# Patient Record
Sex: Female | Born: 1945
Health system: Southern US, Community
[De-identification: ages and names within clinical notes are randomized; demographics above are authoritative.]

## PROBLEM LIST (undated history)

## (undated) DIAGNOSIS — T7840XA Allergy, unspecified, initial encounter: Secondary | ICD-10-CM

## (undated) DIAGNOSIS — I471 Supraventricular tachycardia, unspecified: Secondary | ICD-10-CM

## (undated) DIAGNOSIS — E785 Hyperlipidemia, unspecified: Secondary | ICD-10-CM

## (undated) DIAGNOSIS — Z8619 Personal history of other infectious and parasitic diseases: Secondary | ICD-10-CM

## (undated) HISTORY — PX: OOPHORECTOMY: SHX86

## (undated) HISTORY — DX: Supraventricular tachycardia, unspecified: I47.10

## (undated) HISTORY — DX: Allergy, unspecified, initial encounter: T78.40XA

## (undated) HISTORY — DX: Hyperlipidemia, unspecified: E78.5

## (undated) HISTORY — DX: Personal history of other infectious and parasitic diseases: Z86.19

---

## 1978-06-12 HISTORY — PX: PELVIC LAPAROSCOPY: SHX162

## 1985-06-12 HISTORY — PX: ABDOMINAL HYSTERECTOMY: SHX81

## 1997-10-22 ENCOUNTER — Other Ambulatory Visit: Admission: RE | Admit: 1997-10-22 | Discharge: 1997-10-22 | Payer: Self-pay | Admitting: Obstetrics and Gynecology

## 1998-10-25 ENCOUNTER — Other Ambulatory Visit: Admission: RE | Admit: 1998-10-25 | Discharge: 1998-10-25 | Payer: Self-pay | Admitting: Obstetrics and Gynecology

## 1999-11-01 ENCOUNTER — Other Ambulatory Visit: Admission: RE | Admit: 1999-11-01 | Discharge: 1999-11-01 | Payer: Self-pay | Admitting: Obstetrics and Gynecology

## 2000-12-17 ENCOUNTER — Other Ambulatory Visit: Admission: RE | Admit: 2000-12-17 | Discharge: 2000-12-17 | Payer: Self-pay | Admitting: Obstetrics and Gynecology

## 2001-11-25 ENCOUNTER — Other Ambulatory Visit: Admission: RE | Admit: 2001-11-25 | Discharge: 2001-11-25 | Payer: Self-pay | Admitting: Obstetrics and Gynecology

## 2003-07-31 ENCOUNTER — Other Ambulatory Visit: Admission: RE | Admit: 2003-07-31 | Discharge: 2003-07-31 | Payer: Self-pay | Admitting: Obstetrics and Gynecology

## 2004-08-01 ENCOUNTER — Other Ambulatory Visit: Admission: RE | Admit: 2004-08-01 | Discharge: 2004-08-01 | Payer: Self-pay | Admitting: Obstetrics and Gynecology

## 2005-05-09 DIAGNOSIS — C4492 Squamous cell carcinoma of skin, unspecified: Secondary | ICD-10-CM

## 2005-05-09 HISTORY — DX: Squamous cell carcinoma of skin, unspecified: C44.92

## 2005-08-02 ENCOUNTER — Other Ambulatory Visit: Admission: RE | Admit: 2005-08-02 | Discharge: 2005-08-02 | Payer: Self-pay | Admitting: Obstetrics and Gynecology

## 2006-08-06 ENCOUNTER — Other Ambulatory Visit: Admission: RE | Admit: 2006-08-06 | Discharge: 2006-08-06 | Payer: Self-pay | Admitting: Obstetrics and Gynecology

## 2007-08-14 ENCOUNTER — Other Ambulatory Visit: Admission: RE | Admit: 2007-08-14 | Discharge: 2007-08-14 | Payer: Self-pay | Admitting: Obstetrics and Gynecology

## 2008-08-13 ENCOUNTER — Ambulatory Visit: Payer: Self-pay | Admitting: Obstetrics and Gynecology

## 2008-08-13 ENCOUNTER — Other Ambulatory Visit: Admission: RE | Admit: 2008-08-13 | Discharge: 2008-08-13 | Payer: Self-pay | Admitting: Obstetrics and Gynecology

## 2008-08-13 ENCOUNTER — Encounter: Payer: Self-pay | Admitting: Obstetrics and Gynecology

## 2009-08-16 ENCOUNTER — Other Ambulatory Visit: Admission: RE | Admit: 2009-08-16 | Discharge: 2009-08-16 | Payer: Self-pay | Admitting: Obstetrics and Gynecology

## 2009-08-16 ENCOUNTER — Ambulatory Visit: Payer: Self-pay | Admitting: Obstetrics and Gynecology

## 2009-10-20 ENCOUNTER — Ambulatory Visit: Payer: Self-pay | Admitting: Obstetrics and Gynecology

## 2010-08-29 ENCOUNTER — Encounter (INDEPENDENT_AMBULATORY_CARE_PROVIDER_SITE_OTHER): Payer: BC Managed Care – PPO | Admitting: Obstetrics and Gynecology

## 2010-08-29 ENCOUNTER — Other Ambulatory Visit: Payer: Self-pay | Admitting: Obstetrics and Gynecology

## 2010-08-29 ENCOUNTER — Other Ambulatory Visit (HOSPITAL_COMMUNITY)
Admission: RE | Admit: 2010-08-29 | Discharge: 2010-08-29 | Disposition: A | Discharge status: Home / Self Care | Payer: BC Managed Care – PPO | Source: Ambulatory Visit | Attending: Obstetrics and Gynecology | Admitting: Obstetrics and Gynecology

## 2010-08-29 DIAGNOSIS — Z01419 Encounter for gynecological examination (general) (routine) without abnormal findings: Secondary | ICD-10-CM

## 2010-08-29 DIAGNOSIS — R823 Hemoglobinuria: Secondary | ICD-10-CM

## 2010-08-29 DIAGNOSIS — Z124 Encounter for screening for malignant neoplasm of cervix: Secondary | ICD-10-CM | POA: Insufficient documentation

## 2011-02-23 LAB — HM COLONOSCOPY

## 2011-08-22 DIAGNOSIS — N809 Endometriosis, unspecified: Secondary | ICD-10-CM | POA: Insufficient documentation

## 2011-08-30 ENCOUNTER — Encounter: Payer: BC Managed Care – PPO | Admitting: Obstetrics and Gynecology

## 2011-08-31 ENCOUNTER — Encounter: Payer: Self-pay | Admitting: Obstetrics and Gynecology

## 2011-08-31 ENCOUNTER — Ambulatory Visit (INDEPENDENT_AMBULATORY_CARE_PROVIDER_SITE_OTHER): Payer: BC Managed Care – PPO | Admitting: Obstetrics and Gynecology

## 2011-08-31 VITALS — BP 110/62 | Ht 63.0 in | Wt 141.0 lb

## 2011-08-31 DIAGNOSIS — Z01419 Encounter for gynecological examination (general) (routine) without abnormal findings: Secondary | ICD-10-CM

## 2011-08-31 MED ORDER — ESTRADIOL ACETATE 0.1 MG/24HR VA RING: VAGINAL_RING | VAGINAL | Status: DC

## 2011-08-31 NOTE — Progress Notes (Signed)
Patient came to see me today for her annual GYN exam. She continues to do well in her Femring. She had her yearly mammogram today. She has had several normal bone densities. She does her lab through PCP. She is having no vaginal bleeding. She is having no pelvic pain.  HEENT: Within normal limits. Kennon Portela present Neck: No masses. Supraclavicular lymph nodes: Not enlarged. Breasts: Examined in both sitting and lying position. Symmetrical without skin changes or masses. Abdomen: Soft no masses guarding or rebound. No hernias. Pelvic: External within normal limits. BUS within normal limits. Vaginal examination shows good estrogen effect, no cystocele enterocele or rectocele. Cervix and uterus absent. Adnexa within normal limits. Rectovaginal confirmatory. Extremities within normal limits.  Assessment: Menopausal symptoms  Plan: Continue Femring 0.1 mg

## 2011-09-01 LAB — URINALYSIS W MICROSCOPIC + REFLEX CULTURE
Casts: NONE SEEN
Crystals: NONE SEEN
Leukocytes, UA: NEGATIVE
Nitrite: NEGATIVE
Specific Gravity, Urine: 1.016 (ref 1.005–1.030)
Squamous Epithelial / LPF: NONE SEEN
pH: 6.5 (ref 5.0–8.0)

## 2011-09-11 ENCOUNTER — Encounter: Payer: Self-pay | Admitting: Gynecology

## 2012-09-02 ENCOUNTER — Ambulatory Visit (INDEPENDENT_AMBULATORY_CARE_PROVIDER_SITE_OTHER): Payer: Medicare Other | Admitting: Gynecology

## 2012-09-02 ENCOUNTER — Encounter: Payer: Self-pay | Admitting: Gynecology

## 2012-09-02 VITALS — BP 120/78 | Ht 63.0 in | Wt 142.0 lb

## 2012-09-02 DIAGNOSIS — Z79890 Hormone replacement therapy (postmenopausal): Secondary | ICD-10-CM

## 2012-09-02 DIAGNOSIS — N952 Postmenopausal atrophic vaginitis: Secondary | ICD-10-CM

## 2012-09-02 MED ORDER — ESTRADIOL ACETATE 0.1 MG/24HR VA RING: VAGINAL_RING | VAGINAL | Status: DC

## 2012-09-02 NOTE — Progress Notes (Signed)
Katherine Blake 11/15/1945 [id number]        67 y.o.  [id number] for followup exam.  Former patient of Dr. Eda Paschal. Several issues noted below.  Past medical history,surgical history, medications, allergies, family history and social history were all reviewed and documented in the EPIC chart. ROS:  Was performed and pertinent positives and negatives are included in the history.  Exam: [name] assistant Filed Vitals:   09/02/12 1037  BP: 120/78  Height: 5\' 3"  (1.6 m)  Weight: 142 lb (64.411 kg)   General appearance  Normal Skin grossly normal Head/Neck normal with no cervical or supraclavicular adenopathy thyroid normal Lungs  clear Cardiac RR, without RMG Abdominal  soft, nontender, without masses, organomegaly or hernia Breasts  examined lying and sitting without masses, retractions, discharge or axillary adenopathy. Pelvic  Ext/BUS/vagina  normal with mild atrophic changes   Adnexa  Without masses or tenderness    Anus and perineum  normal   Rectovaginal  normal sphincter tone without palpated masses or tenderness.    Assessment/Plan:  67 y.o. [id number] female for followup exam.   1. HRT. Status post TAH/BSO for endometriosis. Patient is on Femring 0.1 mg for hot flashes/sweats doing well.  Does note some vaginal dryness with dyspareunia. Issues of HRT to include the [facility] study/transdermal/vaginal absorption benefits reviewed to include stroke heart attack DVT and breast cancer. Lowest dose for shortest period of time ACOG and NAMS statements reviewed.  Patient's comfortable using and wants to continue it I refilled her Femring. Options for vaginal support to include switching to a higher oral dose, adding vaginal support such as Estrace cream or Vagifem reviewed. Patient is not interested in just wants to monitor at present. 2. DEXA 10/2009 normal. Recommend repeating at the 5 year interval. Increase calcium vitamin D reviewed. 3. Mammography 08/2012. Continue with annual mammography. SBE  monthly reviewed. 4. Colonoscopy 2011. Repeat Advair recommended interval. 5. Pap smear 2012. No Pap smear done today. No history of significant abnormal Pap smears. He is status post hysterectomy for benign indications. Option to stop screening altogether or less frequent intervals reviewed. We'll readdress on an annual basis. 6. Health maintenance. No lab work done as it is all done through her primary physician's office who she actively sees. Followup one year, sooner as needed.    Dara Lords MD, 11:17 AM 09/02/2012

## 2012-09-02 NOTE — Patient Instructions (Signed)
Follow up in one year, sooner as needed. 

## 2013-09-04 ENCOUNTER — Encounter: Payer: Self-pay | Admitting: Gynecology

## 2013-09-04 ENCOUNTER — Telehealth: Payer: Self-pay | Admitting: *Deleted

## 2013-09-04 ENCOUNTER — Ambulatory Visit (INDEPENDENT_AMBULATORY_CARE_PROVIDER_SITE_OTHER): Payer: Medicare Other | Admitting: Gynecology

## 2013-09-04 VITALS — BP 116/74 | Ht 62.0 in | Wt 141.0 lb

## 2013-09-04 DIAGNOSIS — Z79890 Hormone replacement therapy (postmenopausal): Secondary | ICD-10-CM

## 2013-09-04 DIAGNOSIS — N952 Postmenopausal atrophic vaginitis: Secondary | ICD-10-CM

## 2013-09-04 MED ORDER — ESTRADIOL ACETATE 0.1 MG/24HR VA RING: VAGINAL_RING | VAGINAL | Status: DC

## 2013-09-04 NOTE — Progress Notes (Signed)
Katherine Blake October 09, 1945 [id number]        67 y.o.  [id number] for followup exam.  Several issues noted below.   Past medical history,surgical history, problem list, medications, allergies, family history and social history were all reviewed and documented in the EPIC chart.  ROS:  Performed and pertinent positives and negatives are included in the history, assessment and plan .  Exam: [name] assistant Filed Vitals:   09/04/13 0852  BP: 116/74  Height: 5\' 2"  (1.575 m)  Weight: 141 lb (63.957 kg)   General appearance  Normal Skin grossly normal Head/Neck normal with no cervical or supraclavicular adenopathy thyroid normal Lungs  clear Cardiac RR, without RMG Abdominal  soft, nontender, without masses, organomegaly or hernia Breasts  examined lying and sitting without masses, retractions, discharge or axillary adenopathy. Pelvic  Ext/BUS/vagina with generalized atrophic changes  Adnexa  Without masses or tenderness    Anus and perineum  Normal   Rectovaginal  Normal sphincter tone without palpated masses or tenderness.    Assessment/Plan:  68 y.o. [id number] female for followup exam.  1.  Postmenopausal/atrophic genital changes. Patient continues on Femring 0.1 mg doing well.  I again reviewed the whole issue of ERT with her to include the [facility] study with increased risk of stroke, heart attack, DVT and breast cancer. The ACOG and NAMS statements for lowest dose for the shortest period of time reviewed. Transvaginal versus oral first-pass effect benefit discussed.  Continuing ERT into the 60s discussed. At this point the patient wants to continue it I refilled her x1 year. She is going to decide whether she wants to try to stop or not. 2. Pap smear 2012. No Pap smear done today. Age 37 status post hysterectomy for benign indications. No history of abnormal Pap smears previously. Options to stop screaming altogether versus continued screening reviewed. Patient's comfortable with stop  screaming. 3. Mammography 08/2012. Patient due for mammogram now knows to schedule. Followup in one year, sooner as needed. 4. Colonoscopy 2012. Repeat at their recommended interval. 5. DEXA 2011 normal. Plan repeat DEXA next year at five-year interval. Increase calcium vitamin D reviewed. 6. Health maintenance. No routine blood work done. This is all done through her primary physician's office. Followup one year, sooner as needed.  Note: This document was prepared with digital dictation and possible smart phrase technology. Any transcriptional errors that result from this process are unintentional.   Katherine Auerbach MD, 9:20 AM 09/04/2013

## 2013-09-04 NOTE — Patient Instructions (Signed)
Followup in one year, sooner if any issues.  Estradiol vaginal ring (Femring) What is this medicine? ESTRADIOL (es tra DYE ole) vaginal ring is an insert that contains a female hormone. This medicine helps relieve symptoms of vaginal irritation and dryness that occurs in some women during menopause. This medicine can also help relieve hot flashes. This medicine may be used for other purposes; ask your health care provider or pharmacist if you have questions. COMMON BRAND NAME(S): Femring What should I tell my health care provider before I take this medicine? They need to know if you have any of these conditions: -abnormal vaginal bleeding -blood vessel disease or blood clots -breast, cervical, endometrial, ovarian, liver, or uterine cancer -dementia -diabetes -gallbladder disease -heart disease or recent heart attack -high blood pressure -high cholesterol -high level of calcium in the blood -hysterectomy -kidney disease -liver disease -migraine headaches -protein C deficiency -protein S deficiency -stroke -systemic lupus erythematosus (SLE) -tobacco smoker -an unusual or allergic reaction to estrogens, other hormones, medicines, foods, dyes, or preservatives -pregnant or trying to get pregnant -breast-feeding How should I use this medicine? This medicine may be inserted by you or your physician. Follow the directions that are included with your prescription. If you are unsure how to insert the ring, contact your doctor or health care professional. The vaginal ring should remain in place for 90 days. After 90 days you should replace your old ring and insert a new one. Do not stop using except on the advice of your doctor or health care professional. Contact your pediatrician regarding the use of this medicine in children. Special care may be needed. A patient package insert for the product will be given with each prescription and refill. Read this sheet carefully each time. The sheet  may change frequently. Overdosage: If you think you have taken too much of this medicine contact a poison control center or emergency room at once. NOTE: This medicine is only for you. Do not share this medicine with others. What if I miss a dose? If you miss a dose, use it as soon as you can. If it is almost time for your next dose, use only that dose. Do not use double or extra doses. What may interact with this medicine? Do not take this medicine with any of the following medications: -aromatase inhibitors like aminoglutethimide, anastrozole, exemestane, letrozole, testolactone, vorozole This medicine may also interact with the following medications: -carbamazepine -certain antibiotics used to treat infections -certain barbiturates used for inducing sleep or treating seizures -grapefruit juice -medicines for fungus infections like itraconazole and ketoconazole -raloxifene or tamoxifen -rifabutin, rifampin, or rifapentine -ritonavir -St. John's Wort This list may not describe all possible interactions. Give your health care provider a list of all the medicines, herbs, non-prescription drugs, or dietary supplements you use. Also tell them if you smoke, drink alcohol, or use illegal drugs. Some items may interact with your medicine. What should I watch for while using this medicine? Visit your doctor or health care professional for regular checks on your progress. You will need a regular breast and pelvic exam and Pap smear while on this medicine. You should also discuss the need for regular mammograms with your health care professional, and follow his or her guidelines for these tests. This medicine can make your body retain fluid, making your fingers, hands, or ankles swell. Your blood pressure can go up. Contact your doctor or health care professional if you feel you are retaining fluid. If you have any reason  to think you are pregnant, stop taking this medicine right away and contact your  doctor or health care professional. Smoking increases the risk of getting a blood clot or having a stroke while you are taking this medicine, especially if you are more than 66 years old. You are strongly advised not to smoke. If you wear contact lenses and notice visual changes, or if the lenses begin to feel uncomfortable, consult your eye doctor or health care professional. This medicine can increase the risk of developing a condition (endometrial hyperplasia) that may lead to cancer of the lining of the uterus. Taking progestins, another hormone drug, with this medicine lowers the risk of developing this condition. Therefore, if your uterus has not been removed (by a hysterectomy), your doctor may prescribe a progestin for you to take together with your estrogen. You should know, however, that taking estrogens with progestins may have additional health risks. You should discuss the use of estrogens and progestins with your health care professional to determine the benefits and risks for you. If you are going to have surgery, you may need to stop taking this medicine. Consult your health care professional for advice before you schedule the surgery. You may bathe or participate in other activities while using this medicine. You do not need to remove the vaginal ring during sexual or other activities unless you are more comfortable doing so. Within the 90-day dosage period, you may remove the vaginal ring, rinse it with clean lukewarm (not hot or boiling) water, and re-insert the ring as needed. What side effects may I notice from receiving this medicine? Side effects that you should report to your doctor or health care professional as soon as possible: -allergic reactions like skin rash, itching or hives, swelling of the face, lips, or tongue -breast tissue changes or discharge -changes in vision -chest pain -confusion, trouble speaking or understanding -dark urine -general ill feeling or flu-like  symptoms -light-colored stools -nausea, vomiting -pain, swelling, warmth in the leg -right upper belly pain -severe headaches -shortness of breath -sudden numbness or weakness of the face, arm or leg -trouble walking, dizziness, loss of balance or coordination -unusual vaginal bleeding -yellowing of the eyes or skin Side effects that usually do not require medical attention (report to your doctor or health care professional if they continue or are bothersome): -hair loss -increased hunger or thirst -increased urination -symptoms of vaginal infection like itching, irritation or unusual discharge -unusually weak or tired This list may not describe all possible side effects. Call your doctor for medical advice about side effects. You may report side effects to FDA at 1-800-FDA-1088. Where should I keep my medicine? Keep out of the reach of children. Store at room temperature between 15 and 30 degrees C (59 and 86 degrees F). Throw away any unused medicine after the expiration date. NOTE: This sheet is a summary. It may not cover all possible information. If you have questions about this medicine, talk to your doctor, pharmacist, or health care provider.  2014, [employer] Standard. (2010-08-31 09:10:06)

## 2013-09-04 NOTE — Telephone Encounter (Signed)
Prior authorization for femring sent to [facility], will wait for response.

## 2013-09-05 LAB — URINALYSIS W MICROSCOPIC + REFLEX CULTURE
BILIRUBIN URINE: NEGATIVE
Bacteria, UA: NONE SEEN
Casts: NONE SEEN
Crystals: NONE SEEN
Glucose, UA: NEGATIVE mg/dL
Hgb urine dipstick: NEGATIVE
Ketones, ur: NEGATIVE mg/dL
LEUKOCYTES UA: NEGATIVE
Nitrite: NEGATIVE
PROTEIN: NEGATIVE mg/dL
SQUAMOUS EPITHELIAL / LPF: NONE SEEN
Specific Gravity, Urine: 1.015 (ref 1.005–1.030)
UROBILINOGEN UA: 0.2 mg/dL (ref 0.0–1.0)
pH: 5.5 (ref 5.0–8.0)

## 2013-09-17 ENCOUNTER — Other Ambulatory Visit: Payer: Self-pay | Admitting: Dermatology

## 2013-12-11 LAB — CBC AND DIFFERENTIAL
HEMATOCRIT: 37 (ref 36–46)
HEMOGLOBIN: 12.5 (ref 12.0–16.0)
NEUTROS ABS: 4
PLATELETS: 308 (ref 150–399)
WBC: 6.1

## 2014-04-13 ENCOUNTER — Encounter: Payer: Self-pay | Admitting: Gynecology

## 2014-05-06 LAB — FECAL OCCULT BLOOD, GUAIAC: Fecal Occult Blood: NEGATIVE

## 2014-05-19 ENCOUNTER — Other Ambulatory Visit: Payer: Self-pay | Admitting: Dermatology

## 2014-09-10 ENCOUNTER — Encounter: Payer: Self-pay | Admitting: Gynecology

## 2014-09-10 ENCOUNTER — Ambulatory Visit (INDEPENDENT_AMBULATORY_CARE_PROVIDER_SITE_OTHER): Payer: Medicare Other | Admitting: Gynecology

## 2014-09-10 VITALS — BP 120/72 | Ht 62.0 in | Wt 135.0 lb

## 2014-09-10 DIAGNOSIS — N952 Postmenopausal atrophic vaginitis: Secondary | ICD-10-CM | POA: Diagnosis not present

## 2014-09-10 DIAGNOSIS — Z79890 Hormone replacement therapy (postmenopausal): Secondary | ICD-10-CM | POA: Diagnosis not present

## 2014-09-10 DIAGNOSIS — Z01419 Encounter for gynecological examination (general) (routine) without abnormal findings: Secondary | ICD-10-CM | POA: Diagnosis not present

## 2014-09-10 MED ORDER — ESTRADIOL ACETATE 0.1 MG/24HR VA RING: VAGINAL_RING | VAGINAL | Status: DC

## 2014-09-10 NOTE — Progress Notes (Signed)
Katherine Blake 13-Feb-1946 [id number]        69 y.o.  G2P2002 for breast and pelvic exam. Several issues noted below  Past medical history,surgical history, problem list, medications, allergies, family history and social history were all reviewed and documented as reviewed in the EPIC chart.  ROS:  Performed with pertinent positives and negatives included in the history, assessment and plan.   Additional significant findings :  none   Exam: [name] Counsellor Vitals:   09/10/14 0902  BP: 120/72  Height: 5\' 2"  (1.575 m)  Weight: 135 lb (61.236 kg)   General appearance:  Normal affect, orientation and appearance. Skin: Grossly normal HEENT: Without gross lesions.  No cervical or supraclavicular adenopathy. Thyroid normal.  Lungs:  Clear without wheezing, rales or rhonchi Cardiac: RR, without RMG Abdominal:  Soft, nontender, without masses, guarding, rebound, organomegaly or hernia Breasts:  Examined lying and sitting without masses, retractions, discharge or axillary adenopathy. Pelvic:  Ext/BUS/vagina with generalized atrophic changes  Adnexa  Without masses or tenderness    Anus and perineum  Normal   Rectovaginal  Normal sphincter tone without palpated masses or tenderness.    Assessment/Plan:  69 y.o. [id number] female for breast and pelvic exam.   1. Postmenopausal/HRT. Status post TAH/BSO 1987 for endometriosis using Femring 0.1 doing well. Had started with 0.05 but increased to 0.1 due to insufficient relief of her symptoms originally. I again reviewed the whole issue of HRT with advancing age and increased risk of stroke heart attack DVT and possible breast cancer. Options to wean versus trying a lower dose Femring discussed. At this point patient does not want to change, understands the risks and accepts them. I refilled her Femring 1 year. 2. Pap smear 2012. No Pap smear done today. No history of abnormal Pap smears previously. Status post hysterectomy for benign indications.  We both agreed to stop screening per current screening guidelines issues over the age of 67 and status post hysterectomy. 3. Mammography today. Continue with annual mammography. SBE monthly reviewed. 4. DEXA 2011 normal. Repeat DEXA now at 5 year interval. Increased calcium vitamin D reviewed. 5. Colonoscopy 2012. Repeat at their recommended interval. 6. Health maintenance. No routine blood work done as this is done at her primary physician's office. Follow up 1 year, sooner as needed.     Anastasio Auerbach MD, 9:23 AM 09/10/2014

## 2014-09-10 NOTE — Patient Instructions (Signed)
You may obtain a copy of any labs that were done today by logging onto MyChart as outlined in the instructions provided with your AVS (after visit summary). The office will not call with normal lab results but certainly if there are any significant abnormalities then we will contact you.   Health Maintenance, Female A healthy lifestyle and preventative care can promote health and wellness.  Maintain regular health, dental, and eye exams.  Eat a healthy diet. Foods like vegetables, fruits, whole grains, low-fat dairy products, and lean protein foods contain the nutrients you need without too many calories. Decrease your intake of foods high in solid fats, added sugars, and salt. Get information about a proper diet from your caregiver, if necessary.  Regular physical exercise is one of the most important things you can do for your health. Most adults should get at least 150 minutes of moderate-intensity exercise (any activity that increases your heart rate and causes you to sweat) each week. In addition, most adults need muscle-strengthening exercises on 2 or more days a week.   Maintain a healthy weight. The body mass index (BMI) is a screening tool to identify possible weight problems. It provides an estimate of body fat based on height and weight. Your caregiver can help determine your BMI, and can help you achieve or maintain a healthy weight. For adults 20 years and older:  A BMI below 18.5 is considered underweight.  A BMI of 18.5 to 24.9 is normal.  A BMI of 25 to 29.9 is considered overweight.  A BMI of 30 and above is considered obese.  Maintain normal blood lipids and cholesterol by exercising and minimizing your intake of saturated fat. Eat a balanced diet with plenty of fruits and vegetables. Blood tests for lipids and cholesterol should begin at age 61 and be repeated every 5 years. If your lipid or cholesterol levels are high, you are over 50, or you are a high risk for heart  disease, you may need your cholesterol levels checked more frequently.Ongoing high lipid and cholesterol levels should be treated with medicines if diet and exercise are not effective.  If you smoke, find out from your caregiver how to quit. If you do not use tobacco, do not start.  Lung cancer screening is recommended for adults aged 33 80 years who are at high risk for developing lung cancer because of a history of smoking. Yearly low-dose computed tomography (CT) is recommended for people who have at least a 30-pack-year history of smoking and are a current smoker or have quit within the past 15 years. A pack year of smoking is smoking an average of 1 pack of cigarettes a day for 1 year (for example: 1 pack a day for 30 years or 2 packs a day for 15 years). Yearly screening should continue until the smoker has stopped smoking for at least 15 years. Yearly screening should also be stopped for people who develop a health problem that would prevent them from having lung cancer treatment.  If you are pregnant, do not drink alcohol. If you are breastfeeding, be very cautious about drinking alcohol. If you are not pregnant and choose to drink alcohol, do not exceed 1 drink per day. One drink is considered to be 12 ounces (355 mL) of beer, 5 ounces (148 mL) of wine, or 1.5 ounces (44 mL) of liquor.  Avoid use of street drugs. Do not share needles with anyone. Ask for help if you need support or instructions about stopping  the use of drugs.  High blood pressure causes heart disease and increases the risk of stroke. Blood pressure should be checked at least every 1 to 2 years. Ongoing high blood pressure should be treated with medicines, if weight loss and exercise are not effective.  If you are 96 to 67 years old, ask your caregiver if you should take aspirin to prevent strokes.  Diabetes screening involves taking a blood sample to check your fasting blood sugar level. This should be done once every 3  years, after age 10, if you are within normal weight and without risk factors for diabetes. Testing should be considered at a younger age or be carried out more frequently if you are overweight and have at least 1 risk factor for diabetes.  Breast cancer screening is essential preventative care for women. You should practice "breast self-awareness." This means understanding the normal appearance and feel of your breasts and may include breast self-examination. Any changes detected, no matter how small, should be reported to a caregiver. Women in their 70s and 30s should have a clinical breast exam (CBE) by a caregiver as part of a regular health exam every 1 to 3 years. After age 31, women should have a CBE every year. Starting at age 44, women should consider having a mammogram (breast X-ray) every year. Women who have a family history of breast cancer should talk to their caregiver about genetic screening. Women at a high risk of breast cancer should talk to their caregiver about having an MRI and a mammogram every year.  Breast cancer gene (BRCA)-related cancer risk assessment is recommended for women who have family members with BRCA-related cancers. BRCA-related cancers include breast, ovarian, tubal, and peritoneal cancers. Having family members with these cancers may be associated with an increased risk for harmful changes (mutations) in the breast cancer genes BRCA1 and BRCA2. Results of the assessment will determine the need for genetic counseling and BRCA1 and BRCA2 testing.  The Pap test is a screening test for cervical cancer. Women should have a Pap test starting at age 70. Between ages 21 and 44, Pap tests should be repeated every 2 years. Beginning at age 80, you should have a Pap test every 3 years as long as the past 3 Pap tests have been normal. If you had a hysterectomy for a problem that was not cancer or a condition that could lead to cancer, then you no longer need Pap tests. If you are  between ages 59 and 50, and you have had normal Pap tests going back 10 years, you no longer need Pap tests. If you have had past treatment for cervical cancer or a condition that could lead to cancer, you need Pap tests and screening for cancer for at least 20 years after your treatment. If Pap tests have been discontinued, risk factors (such as a new sexual partner) need to be reassessed to determine if screening should be resumed. Some women have medical problems that increase the chance of getting cervical cancer. In these cases, your caregiver may recommend more frequent screening and Pap tests.  The human papillomavirus (HPV) test is an additional test that may be used for cervical cancer screening. The HPV test looks for the virus that can cause the cell changes on the cervix. The cells collected during the Pap test can be tested for HPV. The HPV test could be used to screen women aged 56 years and older, and should be used in women of any age  who have unclear Pap test results. After the age of 55, women should have HPV testing at the same frequency as a Pap test.  Colorectal cancer can be detected and often prevented. Most routine colorectal cancer screening begins at the age of 44 and continues through age 20. However, your caregiver may recommend screening at an earlier age if you have risk factors for colon cancer. On a yearly basis, your caregiver may provide home test kits to check for hidden blood in the stool. Use of a small camera at the end of a tube, to directly examine the colon (sigmoidoscopy or colonoscopy), can detect the earliest forms of colorectal cancer. Talk to your caregiver about this at age 86, when routine screening begins. Direct examination of the colon should be repeated every 5 to 10 years through age 13, unless early forms of pre-cancerous polyps or small growths are found.  Hepatitis C blood testing is recommended for all people born from [birth year] through 1963 and any  individual with known risks for hepatitis C.  Practice safe sex. Use condoms and avoid high-risk sexual practices to reduce the spread of sexually transmitted infections (STIs). Sexually active women aged 36 and younger should be checked for Chlamydia, which is a common sexually transmitted infection. Older women with new or multiple partners should also be tested for Chlamydia. Testing for other STIs is recommended if you are sexually active and at increased risk.  Osteoporosis is a disease in which the bones lose minerals and strength with aging. This can result in serious bone fractures. The risk of osteoporosis can be identified using a bone density scan. Women ages 20 and over and women at risk for fractures or osteoporosis should discuss screening with their caregivers. Ask your caregiver whether you should be taking a calcium supplement or vitamin D to reduce the rate of osteoporosis.  Menopause can be associated with physical symptoms and risks. Hormone replacement therapy is available to decrease symptoms and risks. You should talk to your caregiver about whether hormone replacement therapy is right for you.  Use sunscreen. Apply sunscreen liberally and repeatedly throughout the day. You should seek shade when your shadow is shorter than you. Protect yourself by wearing long sleeves, pants, a wide-brimmed hat, and sunglasses year round, whenever you are outdoors.  Notify your caregiver of new moles or changes in moles, especially if there is a change in shape or color. Also notify your caregiver if a mole is larger than the size of a pencil eraser.  Stay current with your immunizations. Document Released: 12/12/2010 Document Revised: 09/23/2012 Document Reviewed: 12/12/2010 [facility] Patient Information 2014 [employer].

## 2014-09-11 ENCOUNTER — Encounter: Payer: Self-pay | Admitting: Gynecology

## 2014-09-11 LAB — URINALYSIS W MICROSCOPIC + REFLEX CULTURE
Bilirubin Urine: NEGATIVE
Casts: NONE SEEN
Crystals: NONE SEEN
GLUCOSE, UA: NEGATIVE mg/dL
Hgb urine dipstick: NEGATIVE
Ketones, ur: NEGATIVE mg/dL
Leukocytes, UA: NEGATIVE
Nitrite: NEGATIVE
Protein, ur: NEGATIVE mg/dL
Specific Gravity, Urine: 1.021 (ref 1.005–1.030)
UROBILINOGEN UA: 0.2 mg/dL (ref 0.0–1.0)
pH: 5.5 (ref 5.0–8.0)

## 2014-09-12 LAB — URINE CULTURE: Colony Count: 3000

## 2014-10-01 ENCOUNTER — Ambulatory Visit (INDEPENDENT_AMBULATORY_CARE_PROVIDER_SITE_OTHER): Payer: Medicare Other

## 2014-10-01 ENCOUNTER — Other Ambulatory Visit: Payer: Self-pay | Admitting: Gynecology

## 2014-10-01 DIAGNOSIS — Z780 Asymptomatic menopausal state: Secondary | ICD-10-CM | POA: Diagnosis not present

## 2014-10-01 DIAGNOSIS — Z79890 Hormone replacement therapy (postmenopausal): Secondary | ICD-10-CM

## 2014-12-06 ENCOUNTER — Other Ambulatory Visit: Payer: Self-pay | Admitting: Gynecology

## 2014-12-07 ENCOUNTER — Telehealth: Payer: Self-pay | Admitting: *Deleted

## 2014-12-07 MED ORDER — ESTRADIOL 0.1 MG/24HR TD PTTW: 1.0000 | MEDICATED_PATCH | TRANSDERMAL | Status: DC

## 2014-12-07 NOTE — Telephone Encounter (Signed)
Pt called stating her femring has increased to $460 per month, she asked if you could give her some options of other medications? Please advise

## 2014-12-07 NOTE — Telephone Encounter (Signed)
Pt would like to try to vivelle patch twice weekly, Rx will be sent.

## 2014-12-07 NOTE — Telephone Encounter (Signed)
There are several options that range from pills to patches to lotions. I would recommend a trial of the 0.1 mg Vivelle patch twice weekly. If she wants to talk about it in more detailed then I recommend office visit.

## 2014-12-22 ENCOUNTER — Telehealth: Payer: Self-pay | Admitting: *Deleted

## 2014-12-22 NOTE — Telephone Encounter (Signed)
Pt was prescribed estradiol patch 0.1 mg on 12/07/14 states patches were not working, decided to switch back to femring and pay the price.

## 2015-01-06 ENCOUNTER — Other Ambulatory Visit: Payer: Self-pay | Admitting: Dermatology

## 2015-01-25 ENCOUNTER — Ambulatory Visit (INDEPENDENT_AMBULATORY_CARE_PROVIDER_SITE_OTHER): Payer: Medicare Other | Admitting: Gynecology

## 2015-01-25 ENCOUNTER — Encounter: Payer: Self-pay | Admitting: Gynecology

## 2015-01-25 VITALS — BP 120/76

## 2015-01-25 DIAGNOSIS — Z79890 Hormone replacement therapy (postmenopausal): Secondary | ICD-10-CM | POA: Diagnosis not present

## 2015-01-25 NOTE — Progress Notes (Signed)
BARBARA AHART 1945-12-26 [id number]        69 y.o.  [id number] Presents to discuss HRT. She was on Femring which cost $400. Was switched to Vivelle 0.1 milligram patch. Doing well with this.  Has done reading about HRT. She was worried about increased risk of dementia as her mother was just placed into a nursing facility because of this.  Past medical history,surgical history, problem list, medications, allergies, family history and social history were all reviewed and documented in the EPIC chart.  Directed ROS with pertinent positives and negatives documented in the history of present illness/assessment and plan.  Exam: Filed Vitals:   01/25/15 0806  BP: 120/76   General appearance:  Normal  Assessment/Plan:  69 y.o. [id number] on HRT with questions. I reviewed the whole issue of HRT to include the WHI study, ACOG and NAMS recommendations. I discussed the timing hypothesis as far as starting earlier versus starting later and the most recent [place] study showing an increased risk of stroke and heart attack within the first year after discontinuation of HRT and patients in their 56s. The risks of stroke heart attack DVT, breast cancer, dementia versus benefits to include possible cardiovascular started early colon cancer bone health symptom relief reviewed. The issues of estrogen alone versus estrogen and progesterone with a uterus also discussed.  I did discuss there are studies that did show a higher risk of dementia in estrogen users and again the question as to whether starting early has an effect on this versus starting later. After lengthy discussion at this point the patient's comfortable continuing on HRT. She is going to continue on the patch given the financial issues with the ring at $400 a ring and she will follow up when she is due for her exam.    Anastasio Auerbach MD, 9:13 AM 01/25/2015

## 2015-01-25 NOTE — Patient Instructions (Signed)
Follow up when you're due for your annual exam.

## 2015-02-01 ENCOUNTER — Telehealth: Payer: Self-pay | Admitting: *Deleted

## 2015-02-01 NOTE — Telephone Encounter (Signed)
Prior authorization for estradiol 0.1 mg patch done on cover [email or web address] will wait for response.

## 2015-02-05 NOTE — Telephone Encounter (Signed)
Rx was denied by [facility] states has not met non-formulary exception criteria requirements. I called pt and spoke with her about this and she states she is paying out of pocket for patches. No appeal will be filed.

## 2015-04-29 LAB — LIPID PANEL
Cholesterol: 239 — AB (ref 0–200)
HDL: 85 — AB (ref 35–70)
LDL Cholesterol: 139
LDL/HDL RATIO: 3
Triglycerides: 73 (ref 40–160)

## 2015-04-29 LAB — BASIC METABOLIC PANEL
BUN: 17 (ref 4–21)
CREATININE: 0.5 (ref 0.5–1.1)
GLUCOSE: 92
POTASSIUM: 4.3 (ref 3.4–5.3)
SODIUM: 139 (ref 137–147)

## 2015-04-29 LAB — HEPATIC FUNCTION PANEL
ALT: 14 (ref 7–35)
AST: 19 (ref 13–35)
Alkaline Phosphatase: 52 (ref 25–125)
BILIRUBIN, TOTAL: 0.4

## 2015-05-10 ENCOUNTER — Encounter: Payer: Self-pay | Admitting: Gynecology

## 2015-05-10 ENCOUNTER — Encounter: Payer: Self-pay | Admitting: *Deleted

## 2015-07-06 ENCOUNTER — Telehealth: Payer: Self-pay | Admitting: *Deleted

## 2015-07-06 MED ORDER — ESTRADIOL ACETATE 0.1 MG/24HR VA RING: VAGINAL_RING | VAGINAL | Status: DC

## 2015-07-06 NOTE — Telephone Encounter (Signed)
Okay to switch back to Femring 0.1 mg through her next annual exam

## 2015-07-06 NOTE — Telephone Encounter (Signed)
Pt informed, Rx sent, annual scheduled in April

## 2015-07-06 NOTE — Telephone Encounter (Signed)
Pt currently takes estradiol 01 mg patches c/o increase night sweats, and tender breast, having trouble remembering to change patch. Pt would much rather switch back to femring, she spoke with her insurance company and the price had decreased some. Pt said she felt great with femring with no symptoms. Please advise

## 2015-07-10 DIAGNOSIS — B9789 Other viral agents as the cause of diseases classified elsewhere: Secondary | ICD-10-CM | POA: Diagnosis not present

## 2015-07-10 DIAGNOSIS — J988 Other specified respiratory disorders: Secondary | ICD-10-CM | POA: Diagnosis not present

## 2015-07-21 ENCOUNTER — Other Ambulatory Visit: Payer: Self-pay | Admitting: Dermatology

## 2015-07-21 DIAGNOSIS — L821 Other seborrheic keratosis: Secondary | ICD-10-CM | POA: Diagnosis not present

## 2015-07-21 DIAGNOSIS — D485 Neoplasm of uncertain behavior of skin: Secondary | ICD-10-CM | POA: Diagnosis not present

## 2015-09-13 ENCOUNTER — Encounter: Payer: Self-pay | Admitting: Gynecology

## 2015-09-13 ENCOUNTER — Ambulatory Visit (INDEPENDENT_AMBULATORY_CARE_PROVIDER_SITE_OTHER): Payer: Medicare Other | Admitting: Gynecology

## 2015-09-13 VITALS — BP 118/74 | Ht 63.0 in | Wt 141.0 lb

## 2015-09-13 DIAGNOSIS — N952 Postmenopausal atrophic vaginitis: Secondary | ICD-10-CM | POA: Diagnosis not present

## 2015-09-13 DIAGNOSIS — Z79890 Hormone replacement therapy (postmenopausal): Secondary | ICD-10-CM

## 2015-09-13 DIAGNOSIS — Z01419 Encounter for gynecological examination (general) (routine) without abnormal findings: Secondary | ICD-10-CM | POA: Diagnosis not present

## 2015-09-13 DIAGNOSIS — Z1272 Encounter for screening for malignant neoplasm of vagina: Secondary | ICD-10-CM | POA: Diagnosis not present

## 2015-09-13 MED ORDER — ESTRADIOL ACETATE 0.1 MG/24HR VA RING
VAGINAL_RING | VAGINAL | Status: DC
Start: 2015-09-13 — End: 2016-09-15

## 2015-09-13 NOTE — Patient Instructions (Signed)

## 2015-09-13 NOTE — Progress Notes (Signed)
    Katherine Blake 1947/09/68 [id number]        70 y.o.  [id number]  for breast and pelvic exam.  Past medical history,surgical history, problem list, medications, allergies, family history and social history were all reviewed and documented as reviewed in the EPIC chart.  ROS:  Performed with pertinent positives and negatives included in the history, assessment and plan.   Additional significant findings :  none   Exam: Caryn Bee assistant Filed Vitals:   09/13/15 0857  BP: 118/74  Height: 5\' 3"  (1.6 m)  Weight: 141 lb (63.957 kg)   General appearance:  Normal affect, orientation and appearance. Skin: Grossly normal HEENT: Without gross lesions.  No cervical or supraclavicular adenopathy. Thyroid normal.  Lungs:  Clear without wheezing, rales or rhonchi Cardiac: RR, without RMG Abdominal:  Soft, nontender, without masses, guarding, rebound, organomegaly or hernia Breasts:  Examined lying and sitting without masses, retractions, discharge or axillary adenopathy. Pelvic:  Ext/BUS/vagina with atrophic changes Femring in place. Pap smear of cuff done  Adnexa without masses or tenderness    Anus and perineum normal   Rectovaginal normal sphincter tone without palpated masses or tenderness.    Assessment/Plan:  70 y.o. [id number] female for breast and pelvic exam.   1. Postmenopausal/atrophic genital changes.  Currently on Femring 0.1 mg. Had transiently tried patches due to cost but her Femring's cost is cheaper and she returned to this.  Status post TAH/BSO for endometriosis 1987. Again reviewed the issues and risks of ERT to include increased risk of stroke heart attack DVT and possible breast cancer. Options for weaning versus continuing discussed. At this point patient wants to continue understanding and accepting the risks and I refilled 1 year. 2. Pap smear 2012. Pap smear of vaginal cuff done today. Reviewed current screening guidelines. Options to stop screening altogether based  on age and hysterectomy history discussed. Will readdress on an annual basis. 3. Mammography scheduled and she'll follow up for this. SBE monthly reviewed. 4. DEXA 04/2015 normal. Plan repeat at 5 year interval. Increase calcium vitamin D reviewed. 5. Colonoscopy 2012. She is unclear when she is due to repeat this. She is going to call the facility where she had it done check with them and follow their recommendations. 6. Health maintenance. No routine lab work done as the patient does this at her primary physician's office. Follow up 1 year, sooner as needed.   Anastasio Auerbach MD, 10:02 AM 09/13/2015

## 2015-09-14 ENCOUNTER — Telehealth: Payer: Self-pay | Admitting: *Deleted

## 2015-09-14 LAB — PAP IG W/ RFLX HPV ASCU

## 2015-09-14 NOTE — Telephone Encounter (Signed)
PA approved effective 06/15/15-09/12/16 for femring 0.1 mg, pharmacy informed as well.

## 2015-09-29 DIAGNOSIS — Z1231 Encounter for screening mammogram for malignant neoplasm of breast: Secondary | ICD-10-CM | POA: Diagnosis not present

## 2015-12-30 DIAGNOSIS — H52203 Unspecified astigmatism, bilateral: Secondary | ICD-10-CM | POA: Diagnosis not present

## 2015-12-30 DIAGNOSIS — H5213 Myopia, bilateral: Secondary | ICD-10-CM | POA: Diagnosis not present

## 2016-03-17 DIAGNOSIS — Z23 Encounter for immunization: Secondary | ICD-10-CM | POA: Diagnosis not present

## 2016-04-10 ENCOUNTER — Other Ambulatory Visit: Payer: Self-pay | Admitting: Dermatology

## 2016-04-10 DIAGNOSIS — D044 Carcinoma in situ of skin of scalp and neck: Secondary | ICD-10-CM | POA: Diagnosis not present

## 2016-04-10 DIAGNOSIS — L570 Actinic keratosis: Secondary | ICD-10-CM | POA: Diagnosis not present

## 2016-04-10 DIAGNOSIS — D0439 Carcinoma in situ of skin of other parts of face: Secondary | ICD-10-CM | POA: Diagnosis not present

## 2016-04-10 DIAGNOSIS — C4492 Squamous cell carcinoma of skin, unspecified: Secondary | ICD-10-CM

## 2016-04-10 HISTORY — DX: Squamous cell carcinoma of skin, unspecified: C44.92

## 2016-04-26 DIAGNOSIS — D044 Carcinoma in situ of skin of scalp and neck: Secondary | ICD-10-CM | POA: Diagnosis not present

## 2016-04-26 DIAGNOSIS — D0439 Carcinoma in situ of skin of other parts of face: Secondary | ICD-10-CM | POA: Diagnosis not present

## 2016-05-13 DIAGNOSIS — S161XXA Strain of muscle, fascia and tendon at neck level, initial encounter: Secondary | ICD-10-CM | POA: Diagnosis not present

## 2016-05-29 DIAGNOSIS — J029 Acute pharyngitis, unspecified: Secondary | ICD-10-CM | POA: Diagnosis not present

## 2016-06-21 DIAGNOSIS — J0100 Acute maxillary sinusitis, unspecified: Secondary | ICD-10-CM | POA: Diagnosis not present

## 2016-06-27 DIAGNOSIS — Z85828 Personal history of other malignant neoplasm of skin: Secondary | ICD-10-CM | POA: Diagnosis not present

## 2016-06-27 DIAGNOSIS — L821 Other seborrheic keratosis: Secondary | ICD-10-CM | POA: Diagnosis not present

## 2016-06-27 DIAGNOSIS — D229 Melanocytic nevi, unspecified: Secondary | ICD-10-CM | POA: Diagnosis not present

## 2016-08-17 DIAGNOSIS — J069 Acute upper respiratory infection, unspecified: Secondary | ICD-10-CM | POA: Diagnosis not present

## 2016-09-15 ENCOUNTER — Ambulatory Visit (INDEPENDENT_AMBULATORY_CARE_PROVIDER_SITE_OTHER): Payer: Medicare Other | Admitting: Gynecology

## 2016-09-15 ENCOUNTER — Encounter: Payer: Self-pay | Admitting: Gynecology

## 2016-09-15 VITALS — BP 120/76 | Ht 62.0 in | Wt 146.0 lb

## 2016-09-15 DIAGNOSIS — N952 Postmenopausal atrophic vaginitis: Secondary | ICD-10-CM

## 2016-09-15 DIAGNOSIS — Z01411 Encounter for gynecological examination (general) (routine) with abnormal findings: Secondary | ICD-10-CM | POA: Diagnosis not present

## 2016-09-15 MED ORDER — ESTRADIOL ACETATE 0.1 MG/24HR VA RING: VAGINAL_RING | VAGINAL | 4 refills | Status: DC

## 2016-09-15 NOTE — Patient Instructions (Signed)

## 2016-09-15 NOTE — Progress Notes (Signed)
    Katherine Blake 03-06-1946 [id number]        71 y.o.  [id number] for breast and pelvic exam.  Past medical history,surgical history, problem list, medications, allergies, family history and social history were all reviewed and documented as reviewed in the EPIC chart.  ROS:  Performed with pertinent positives and negatives included in the history, assessment and plan.   Additional significant findings :   None   Exam: Caryn Bee assistant Vitals:   09/15/16 0913  BP: 120/76  Weight: 146 lb (66.2 kg)  Height: 5\' 2"  (1.575 m)   Body mass index is 26.7 kg/m.  General appearance:  Normal affect, orientation and appearance. Skin: Grossly normal HEENT: Without gross lesions.  No cervical or supraclavicular adenopathy. Thyroid normal.  Lungs:  Clear without wheezing, rales or rhonchi Cardiac: RR, without RMG Abdominal:  Soft, nontender, without masses, guarding, rebound, organomegaly or hernia Breasts:  Examined lying and sitting without masses, retractions, discharge or axillary adenopathy. Pelvic:  Ext, BUS, Vagina:  with atrophic changes  Adnexa: Without masses or tenderness    Anus and perineum: Normal   Rectovaginal: Normal sphincter tone without palpated masses or tenderness.    Assessment/Plan:  71 y.o. [id number] female breasts and pelvic exam.   1. Postmenopausal/atrophic genital changes. Status post TAH/BSO for endometriosis 1987. Using Femring 0.1 mg. Doing well with this and wants to continue. I again reviewed the risks to include increased risk of thrombosis such as stroke heart attack DVT and the breast cancer issue. Patient's comfortable continuing and I refilled her 1 year. 2. Pap smear 2017. No Pap smear done today. Options to stop screening per current screening guidelines based on age and hysterectomy history reviewed. Will readdress on annual basis. 3. Mammography 09/2015. Follow up for mammogram now as she is due and she will arrange this. SBE monthly  reviewed. 4. DEXA 2016 normal. Plan repeat DEXA at 5 year interval. 5. Colonoscopy 2012. Repeat at their recommended interval. 6. Health maintenance. No routine lab work done as patient does this elsewhere. Follow up in one year, sooner as needed.   Anastasio Auerbach MD, 9:42 AM 09/15/2016

## 2016-10-02 ENCOUNTER — Encounter: Payer: Self-pay | Admitting: Gynecology

## 2016-10-02 DIAGNOSIS — Z1231 Encounter for screening mammogram for malignant neoplasm of breast: Secondary | ICD-10-CM | POA: Diagnosis not present

## 2016-10-20 ENCOUNTER — Other Ambulatory Visit: Payer: Self-pay | Admitting: Allergy and Immunology

## 2016-10-20 ENCOUNTER — Ambulatory Visit
Admission: RE | Admit: 2016-10-20 | Discharge: 2016-10-20 | Disposition: A | Discharge status: Home / Self Care | Payer: Medicare Other | Source: Ambulatory Visit | Attending: Allergy and Immunology | Admitting: Allergy and Immunology

## 2016-10-20 DIAGNOSIS — J3089 Other allergic rhinitis: Secondary | ICD-10-CM | POA: Diagnosis not present

## 2016-10-20 DIAGNOSIS — R05 Cough: Secondary | ICD-10-CM | POA: Diagnosis not present

## 2016-10-20 DIAGNOSIS — R059 Cough, unspecified: Secondary | ICD-10-CM

## 2016-11-08 ENCOUNTER — Telehealth: Payer: Self-pay | Admitting: *Deleted

## 2016-11-08 NOTE — Telephone Encounter (Signed)
PA done via [facility] for femring 0.1, faxed to [facility] will wait for response.

## 2016-11-09 NOTE — Telephone Encounter (Signed)
Medicare denied coverage for Femring must try and fail a formulary medication such as estrace cream. Pt said she has and will continue to pay out of pocket for medication.

## 2016-11-20 ENCOUNTER — Other Ambulatory Visit: Payer: Self-pay | Admitting: Gynecology

## 2016-11-21 ENCOUNTER — Telehealth: Payer: Self-pay | Admitting: *Deleted

## 2016-11-21 NOTE — Telephone Encounter (Signed)
Estring will treat vaginal symptoms such as dryness but is not absorbed and we'll not address systemic symptoms such as hot flushes or night sweats. If insurance does not pay for Femring then I doubt they will pay for Estring. If she wants to try Estring to treat vaginal symptoms only then okay to prescribe.

## 2016-11-21 NOTE — Telephone Encounter (Signed)
Okay for Estrace vaginal cream dispense 1 tube. Apply 2 g dose intravaginal twice weekly. Refill 3

## 2016-11-21 NOTE — Telephone Encounter (Signed)
Pt called Femring 0.1 price has increased to $400 per ring, pt said she could afford at $200, too expensive now.  Medicare prefers patient to try estrace cream, pt asked about estring and if this would be an option for her, I explained to her the preferred was estrace and we can't promise they will pay for estring. Pt asked if estring Rx could be sent in if this is option. Please advise

## 2016-11-21 NOTE — Telephone Encounter (Signed)
Pt said never mind for estring, she would like to try the estrace vaginal cream.

## 2016-11-22 MED ORDER — ESTRADIOL 0.1 MG/GM VA CREA: TOPICAL_CREAM | VAGINAL | 3 refills | Status: DC

## 2016-11-22 NOTE — Telephone Encounter (Signed)
Rx sent pt aware 

## 2017-01-30 ENCOUNTER — Encounter: Payer: Self-pay | Admitting: General Practice

## 2017-02-22 ENCOUNTER — Encounter: Payer: Self-pay | Admitting: Family Medicine

## 2017-02-22 ENCOUNTER — Ambulatory Visit (INDEPENDENT_AMBULATORY_CARE_PROVIDER_SITE_OTHER): Payer: Medicare Other | Admitting: Family Medicine

## 2017-02-22 DIAGNOSIS — Z23 Encounter for immunization: Secondary | ICD-10-CM | POA: Diagnosis not present

## 2017-02-22 DIAGNOSIS — E785 Hyperlipidemia, unspecified: Secondary | ICD-10-CM

## 2017-02-22 DIAGNOSIS — E663 Overweight: Secondary | ICD-10-CM | POA: Diagnosis not present

## 2017-02-22 LAB — CBC WITH DIFFERENTIAL/PLATELET
BASOS PCT: 0.7 % (ref 0.0–3.0)
Basophils Absolute: 0 10*3/uL (ref 0.0–0.1)
EOS ABS: 0.2 10*3/uL (ref 0.0–0.7)
Eosinophils Relative: 3.4 % (ref 0.0–5.0)
HCT: 38.6 % (ref 36.0–46.0)
Hemoglobin: 12.7 g/dL (ref 12.0–15.0)
LYMPHS PCT: 34 % (ref 12.0–46.0)
Lymphs Abs: 1.6 10*3/uL (ref 0.7–4.0)
MCHC: 32.8 g/dL (ref 30.0–36.0)
MCV: 89.3 fl (ref 78.0–100.0)
Monocytes Absolute: 0.3 10*3/uL (ref 0.1–1.0)
Monocytes Relative: 5.6 % (ref 3.0–12.0)
NEUTROS ABS: 2.6 10*3/uL (ref 1.4–7.7)
Neutrophils Relative %: 56.3 % (ref 43.0–77.0)
PLATELETS: 294 10*3/uL (ref 150.0–400.0)
RBC: 4.33 Mil/uL (ref 3.87–5.11)
RDW: 13.6 % (ref 11.5–15.5)
WBC: 4.6 10*3/uL (ref 4.0–10.5)

## 2017-02-22 LAB — LIPID PANEL
Cholesterol: 211 mg/dL — ABNORMAL HIGH (ref 0–200)
HDL: 84.1 mg/dL (ref >39.00)
LDL Cholesterol: 91 mg/dL (ref 0–99)
NONHDL: 127.39
Total CHOL/HDL Ratio: 3
Triglycerides: 182 mg/dL — ABNORMAL HIGH (ref 0.0–149.0)
VLDL: 36.4 mg/dL (ref 0.0–40.0)

## 2017-02-22 LAB — HEPATIC FUNCTION PANEL
ALT: 14 U/L (ref 0–35)
AST: 18 U/L (ref 0–37)
Albumin: 4.5 g/dL (ref 3.5–5.2)
Alkaline Phosphatase: 48 U/L (ref 39–117)
BILIRUBIN DIRECT: 0.1 mg/dL (ref 0.0–0.3)
BILIRUBIN TOTAL: 0.4 mg/dL (ref 0.2–1.2)
Total Protein: 6.5 g/dL (ref 6.0–8.3)

## 2017-02-22 LAB — BASIC METABOLIC PANEL
BUN: 21 mg/dL (ref 6–23)
CALCIUM: 9.7 mg/dL (ref 8.4–10.5)
CHLORIDE: 105 meq/L (ref 96–112)
CO2: 30 meq/L (ref 19–32)
Creatinine, Ser: 0.72 mg/dL (ref 0.40–1.20)
GFR: 85.25 mL/min (ref >60.00)
Glucose, Bld: 79 mg/dL (ref 70–99)
Potassium: 3.7 mEq/L (ref 3.5–5.1)
SODIUM: 141 meq/L (ref 135–145)

## 2017-02-22 LAB — TSH: TSH: 2.87 u[IU]/mL (ref 0.35–4.50)

## 2017-02-22 NOTE — Patient Instructions (Signed)
Schedule your Medicare Wellness Visit w/ our Health Coach, Maudie Mercury in 6 months Follow up with me in 1 year or as needed We'll notify you of your lab results and make any changes if needed Continue to work on healthy diet and regular exercise- you can do it! Call with any questions or concerns Welcome!  We're glad to have you! Stay Safe!!!

## 2017-02-22 NOTE — Assessment & Plan Note (Signed)
New to provider.  Pt is working on [employer] and regular exercise.  Check labs and determine if medication is needed.

## 2017-02-22 NOTE — Assessment & Plan Note (Signed)
New to provider, ongoing for pt.  She is aware that she is overweight and working on healthy diet and regular exercise.  Check labs to risk stratify.  Will follow.

## 2017-02-22 NOTE — Progress Notes (Signed)
Pre visit review using our clinic review tool, if applicable. No additional management support is needed unless otherwise documented below in the visit note. 

## 2017-02-22 NOTE — Progress Notes (Signed)
   Subjective:    Patient ID: Katherine Blake, female    DOB: 03-Feb-1946, 71 y.o.   MRN: [id number]  HPI New to establish.  Previous MD- [facility] Maintenance- UTD on mammo [facility] colonoscopy, DEXA.    Hyperlipidemia- pt's last LDL was 139.  'i'm working on my weight'.  Using [employer].  Walking for exercise.  No CP, SOB, HAs, visual changes, abd pain, N/V, edema.  Pt is not fasting today.   Review of Systems For ROS see HPI     Objective:   Physical Exam  Constitutional: She is oriented to person, place, and time. She appears well-developed and well-nourished. No distress.  HENT:  Head: Normocephalic and atraumatic.  Eyes: Pupils are equal, round, and reactive to light. Conjunctivae and EOM are normal.  Neck: Normal range of motion. Neck supple. No thyromegaly present.  Cardiovascular: Normal rate, regular rhythm, normal heart sounds and intact distal pulses.   No murmur heard. Pulmonary/Chest: Effort normal and breath sounds normal. No respiratory distress.  Abdominal: Soft. She exhibits no distension. There is no tenderness.  Musculoskeletal: She exhibits no edema.  Lymphadenopathy:    She has no cervical adenopathy.  Neurological: She is alert and oriented to person, place, and time.  Skin: Skin is warm and dry.  Psychiatric: She has a normal mood and affect. Her behavior is normal.  Vitals reviewed.         Assessment & Plan:

## 2017-02-26 ENCOUNTER — Encounter: Payer: Self-pay | Admitting: General Practice

## 2017-02-26 DIAGNOSIS — L821 Other seborrheic keratosis: Secondary | ICD-10-CM | POA: Diagnosis not present

## 2017-02-26 DIAGNOSIS — L570 Actinic keratosis: Secondary | ICD-10-CM | POA: Diagnosis not present

## 2017-02-26 DIAGNOSIS — L719 Rosacea, unspecified: Secondary | ICD-10-CM | POA: Diagnosis not present

## 2017-03-26 ENCOUNTER — Other Ambulatory Visit: Payer: Self-pay | Admitting: Dermatology

## 2017-03-26 DIAGNOSIS — L281 Prurigo nodularis: Secondary | ICD-10-CM | POA: Diagnosis not present

## 2017-03-26 DIAGNOSIS — L570 Actinic keratosis: Secondary | ICD-10-CM | POA: Diagnosis not present

## 2017-03-26 DIAGNOSIS — D492 Neoplasm of unspecified behavior of bone, soft tissue, and skin: Secondary | ICD-10-CM | POA: Diagnosis not present

## 2017-04-03 DIAGNOSIS — J3089 Other allergic rhinitis: Secondary | ICD-10-CM | POA: Diagnosis not present

## 2017-04-03 DIAGNOSIS — J069 Acute upper respiratory infection, unspecified: Secondary | ICD-10-CM | POA: Diagnosis not present

## 2017-04-03 DIAGNOSIS — R05 Cough: Secondary | ICD-10-CM | POA: Diagnosis not present

## 2017-07-16 ENCOUNTER — Other Ambulatory Visit: Payer: Self-pay | Admitting: Gynecology

## 2017-08-30 ENCOUNTER — Other Ambulatory Visit: Payer: Self-pay

## 2017-08-30 ENCOUNTER — Ambulatory Visit (INDEPENDENT_AMBULATORY_CARE_PROVIDER_SITE_OTHER): Payer: Medicare Other

## 2017-08-30 VITALS — BP 118/60 | HR 76 | Ht 62.0 in | Wt 150.6 lb

## 2017-08-30 DIAGNOSIS — E2839 Other primary ovarian failure: Secondary | ICD-10-CM

## 2017-08-30 DIAGNOSIS — Z0000 Encounter for general adult medical examination without abnormal findings: Secondary | ICD-10-CM

## 2017-08-30 DIAGNOSIS — Z23 Encounter for immunization: Secondary | ICD-10-CM | POA: Diagnosis not present

## 2017-08-30 MED ORDER — ZOSTER VAC RECOMB ADJUVANTED 50 MCG/0.5ML IM SUSR
0.5000 mL | Freq: Once | INTRAMUSCULAR | 1 refills | Status: AC
Start: 2017-08-30 — End: 2017-08-30

## 2017-08-30 NOTE — Patient Instructions (Addendum)
Shingles vaccine at pharmacy.   Schedule bone scan with mammogram.   Continue doing brain stimulating activities (puzzles, reading, adult coloring books, staying active) to keep memory sharp.   Bring a copy of your living will and/or healthcare power of attorney to your next office visit.  Health Maintenance, Female Adopting a healthy lifestyle and getting preventive care can go a long way to promote health and wellness. Talk with your health care provider about what schedule of regular examinations is right for you. This is a good chance for you to check in with your provider about disease prevention and staying healthy. In between checkups, there are plenty of things you can do on your own. Experts have done a lot of research about which lifestyle changes and preventive measures are most likely to keep you healthy. Ask your health care provider for more information. Weight and diet Eat a healthy diet  Be sure to include plenty of vegetables, fruits, low-fat dairy products, and lean protein.  Do not eat a lot of foods high in solid fats, added sugars, or salt.  Get regular exercise. This is one of the most important things you can do for your health. ? Most adults should exercise for at least 150 minutes each week. The exercise should increase your heart rate and make you sweat (moderate-intensity exercise). ? Most adults should also do strengthening exercises at least twice a week. This is in addition to the moderate-intensity exercise.  Maintain a healthy weight  Body mass index (BMI) is a measurement that can be used to identify possible weight problems. It estimates body fat based on height and weight. Your health care provider can help determine your BMI and help you achieve or maintain a healthy weight.  For females 65 years of age and older: ? A BMI below 18.5 is considered underweight. ? A BMI of 18.5 to 24.9 is normal. ? A BMI of 25 to 29.9 is considered overweight. ? A BMI of  30 and above is considered obese.  Watch levels of cholesterol and blood lipids  You should start having your blood tested for lipids and cholesterol at 70 years of age, then have this test every 5 years.  You may need to have your cholesterol levels checked more often if: ? Your lipid or cholesterol levels are high. ? You are older than 70 years of age. ? You are at high risk for heart disease.  Cancer screening Lung Cancer  Lung cancer screening is recommended for adults 23-21 years old who are at high risk for lung cancer because of a history of smoking.  A yearly low-dose CT scan of the lungs is recommended for people who: ? Currently smoke. ? Have quit within the past 15 years. ? Have at least a 30-pack-year history of smoking. A pack year is smoking an average of one pack of cigarettes a day for 1 year.  Yearly screening should continue until it has been 15 years since you quit.  Yearly screening should stop if you develop a health problem that would prevent you from having lung cancer treatment.  Breast Cancer  Practice breast self-awareness. This means understanding how your breasts normally appear and feel.  It also means doing regular breast self-exams. Let your health care provider know about any changes, no matter how small.  If you are in your 20s or 30s, you should have a clinical breast exam (CBE) by a health care provider every 1-3 years as part of a regular  health exam.  If you are 40 or older, have a CBE every year. Also consider having a breast X-ray (mammogram) every year.  If you have a family history of breast cancer, talk to your health care provider about genetic screening.  If you are at high risk for breast cancer, talk to your health care provider about having an MRI and a mammogram every year.  Breast cancer gene (BRCA) assessment is recommended for women who have family members with BRCA-related cancers. BRCA-related cancers  include: ? Breast. ? Ovarian. ? Tubal. ? Peritoneal cancers.  Results of the assessment will determine the need for genetic counseling and BRCA1 and BRCA2 testing.  Cervical Cancer Your health care provider may recommend that you be screened regularly for cancer of the pelvic organs (ovaries, uterus, and vagina). This screening involves a pelvic examination, including checking for microscopic changes to the surface of your cervix (Pap test). You may be encouraged to have this screening done every 3 years, beginning at age 21.  For women ages 30-65, health care providers may recommend pelvic exams and Pap testing every 3 years, or they may recommend the Pap and pelvic exam, combined with testing for human papilloma virus (HPV), every 5 years. Some types of HPV increase your risk of cervical cancer. Testing for HPV may also be done on women of any age with unclear Pap test results.  Other health care providers may not recommend any screening for nonpregnant women who are considered low risk for pelvic cancer and who do not have symptoms. Ask your health care provider if a screening pelvic exam is right for you.  If you have had past treatment for cervical cancer or a condition that could lead to cancer, you need Pap tests and screening for cancer for at least 20 years after your treatment. If Pap tests have been discontinued, your risk factors (such as having a new sexual partner) need to be reassessed to determine if screening should resume. Some women have medical problems that increase the chance of getting cervical cancer. In these cases, your health care provider may recommend more frequent screening and Pap tests.  Colorectal Cancer  This type of cancer can be detected and often prevented.  Routine colorectal cancer screening usually begins at 70 years of age and continues through 70 years of age.  Your health care provider may recommend screening at an earlier age if you have risk factors  for colon cancer.  Your health care provider may also recommend using home test kits to check for hidden blood in the stool.  A small camera at the end of a tube can be used to examine your colon directly (sigmoidoscopy or colonoscopy). This is done to check for the earliest forms of colorectal cancer.  Routine screening usually begins at age 50.  Direct examination of the colon should be repeated every 5-10 years through 70 years of age. However, you may need to be screened more often if early forms of precancerous polyps or small growths are found.  Skin Cancer  Check your skin from head to toe regularly.  Tell your health care provider about any new moles or changes in moles, especially if there is a change in a mole's shape or color.  Also tell your health care provider if you have a mole that is larger than the size of a pencil eraser.  Always use sunscreen. Apply sunscreen liberally and repeatedly throughout the day.  Protect yourself by wearing long sleeves, pants, a   wide-brimmed hat, and sunglasses whenever you are outside.  Heart disease, diabetes, and high blood pressure  High blood pressure causes heart disease and increases the risk of stroke. High blood pressure is more likely to develop in: ? People who have blood pressure in the high end of the normal range (130-139/85-89 mm Hg). ? People who are overweight or obese. ? People who are African American.  If you are 54-30 years of age, have your blood pressure checked every 3-5 years. If you are 3 years of age or older, have your blood pressure checked every year. You should have your blood pressure measured twice-once when you are at a hospital or clinic, and once when you are not at a hospital or clinic. Record the average of the two measurements. To check your blood pressure when you are not at a hospital or clinic, you can use: ? An automated blood pressure machine at a pharmacy. ? A home blood pressure monitor.  If  you are between 55 years and 21 years old, ask your health care provider if you should take aspirin to prevent strokes.  Have regular diabetes screenings. This involves taking a blood sample to check your fasting blood sugar level. ? If you are at a normal weight and have a low risk for diabetes, have this test once every three years after 70 years of age. ? If you are overweight and have a high risk for diabetes, consider being tested at a younger age or more often. Preventing infection Hepatitis B  If you have a higher risk for hepatitis B, you should be screened for this virus. You are considered at high risk for hepatitis B if: ? You were born in a country where hepatitis B is common. Ask your health care provider which countries are considered high risk. ? Your parents were born in a high-risk country, and you have not been immunized against hepatitis B (hepatitis B vaccine). ? You have HIV or AIDS. ? You use needles to inject street drugs. ? You live with someone who has hepatitis B. ? You have had sex with someone who has hepatitis B. ? You get hemodialysis treatment. ? You take certain medicines for conditions, including cancer, organ transplantation, and autoimmune conditions.  Hepatitis C  Blood testing is recommended for: ? Everyone born from [birth year] through 1963. ? Anyone with known risk factors for hepatitis C.  Sexually transmitted infections (STIs)  You should be screened for sexually transmitted infections (STIs) including gonorrhea and chlamydia if: ? You are sexually active and are younger than 70 years of age. ? You are older than 70 years of age and your health care provider tells you that you are at risk for this type of infection. ? Your sexual activity has changed since you were last screened and you are at an increased risk for chlamydia or gonorrhea. Ask your health care provider if you are at risk.  If you do not have HIV, but are at risk, it may be recommended  that you take a prescription medicine daily to prevent HIV infection. This is called pre-exposure prophylaxis (PrEP). You are considered at risk if: ? You are sexually active and do not regularly use condoms or know the HIV status of your partner(s). ? You take drugs by injection. ? You are sexually active with a partner who has HIV.  Talk with your health care provider about whether you are at high risk of being infected with HIV. If you choose to  begin PrEP, you should first be tested for HIV. You should then be tested every 3 months for as long as you are taking PrEP. Pregnancy  If you are premenopausal and you may become pregnant, ask your health care provider about preconception counseling.  If you may become pregnant, take 400 to 800 micrograms (mcg) of folic acid every day.  If you want to prevent pregnancy, talk to your health care provider about birth control (contraception). Osteoporosis and menopause  Osteoporosis is a disease in which the bones lose minerals and strength with aging. This can result in serious bone fractures. Your risk for osteoporosis can be identified using a bone density scan.  If you are 2 years of age or older, or if you are at risk for osteoporosis and fractures, ask your health care provider if you should be screened.  Ask your health care provider whether you should take a calcium or vitamin D supplement to lower your risk for osteoporosis.  Menopause may have certain physical symptoms and risks.  Hormone replacement therapy may reduce some of these symptoms and risks. Talk to your health care provider about whether hormone replacement therapy is right for you. Follow these instructions at home:  Schedule regular health, dental, and eye exams.  Stay current with your immunizations.  Do not use any tobacco products including cigarettes, chewing tobacco, or electronic cigarettes.  If you are pregnant, do not drink alcohol.  If you are  breastfeeding, limit how much and how often you drink alcohol.  Limit alcohol intake to no more than 1 drink per day for nonpregnant women. One drink equals 12 ounces of beer, 5 ounces of wine, or 1 ounces of hard liquor.  Do not use street drugs.  Do not share needles.  Ask your health care provider for help if you need support or information about quitting drugs.  Tell your health care provider if you often feel depressed.  Tell your health care provider if you have ever been abused or do not feel safe at home. This information is not intended to replace advice given to you by your health care provider. Make sure you discuss any questions you have with your health care provider. Document Released: 12/12/2010 Document Revised: 11/04/2015 Document Reviewed: 03/02/2015 Elsevier Interactive Patient Education  [employer].

## 2017-08-30 NOTE — Progress Notes (Signed)
Reviewed documentation provided by RN and agree.  Annye Asa, MD

## 2017-08-30 NOTE — Progress Notes (Signed)
Subjective:   Katherine Blake is a 72 y.o. female who presents for an Initial Medicare Annual Wellness Visit.  Review of Systems    No ROS.  Medicare Wellness Visit. Additional risk factors are reflected in the social history.   Cardiac Risk Factors include: advanced age (>77men, >76 women);family history of premature cardiovascular disease   Sleep patterns: Sleeps 7 hours.  Home Safety/Smoke Alarms: Feels safe in home. Smoke alarms in place.  Living environment; residence and Firearm Safety: Lives with husband in 2 story home.  Seat Belt Safety/Bike Helmet: Wears seat belt.   Female:   Pap- 2017. F/U scheduled 09/2017   Mammo-10/02/2016, negative. Scheduled at [facility].  Dexa scan-05/10/2015, normal.  Ordered today.        [facility]-Colonoscopy 02/23/2011, normal.       Objective:    Today's Vitals   08/30/17 0807  BP: 118/60  Pulse: 76  SpO2: 98%  Weight: 150 lb 9.6 oz (68.3 kg)  Height: 5\' 2"  (1.575 m)   Body mass index is 27.55 kg/m.  Advanced Directives 08/30/2017  Does Patient Have a Medical Advance Directive? Yes  Type of Paramedic of [place];Living will  Copy of [place] in Chart? No - copy requested    Current Medications (verified) Outpatient Encounter Medications as of 08/30/2017  Medication Sig  . Calcium Carbonate-Vitamin D (CALCIUM + D PO) Take by mouth.  . estradiol (ESTRACE VAGINAL) 0.1 MG/GM vaginal cream INSERT 2 GRAMS VAGINAL TWICE WEEKLY  . fluticasone (FLONASE) 50 MCG/ACT nasal spray One or 2 sprays into each nostril daily for allergy control  . glucosamine-chondroitin 500-400 MG tablet Take 1 tablet by mouth 3 (three) times daily.  . Multiple Vitamins-Minerals (CENTRUM SILVER 50+WOMEN PO) Take by mouth.  . Zoster Vaccine Adjuvanted [facility]) injection Inject 0.5 mLs into the muscle once for 1 dose.   No facility-administered encounter medications on file as of 08/30/2017.     Allergies  (verified) Amoxicillin and Cephalexin   History: Past Medical History:  Diagnosis Date  . Allergy   . History of chicken pox    Past Surgical History:  Procedure Laterality Date  . ABDOMINAL HYSTERECTOMY  1987   TAH,BSO Endometriosis  . CESAREAN SECTION     X 2  . OOPHORECTOMY     BSO  . PELVIC LAPAROSCOPY  1980   Family History  Problem Relation Age of Onset  . Heart failure Father   . Cancer Maternal Grandmother        Leukemia  . Dementia Mother   . CAD Mother   . Thrombocytopenia Mother   . Other Mother        JAK2 positive   Social History   Socioeconomic History  . Marital status: Married    Spouse name: Not on file  . Number of children: Not on file  . Years of education: Not on file  . Highest education level: Not on file  Occupational History  . Not on file  Social Needs  . Financial resource strain: Not on file  . Food insecurity:    Worry: Not on file    Inability: Not on file  . Transportation needs:    Medical: Not on file    Non-medical: Not on file  Tobacco Use  . Smoking status: Never Smoker  . Smokeless tobacco: Never Used  Substance and Sexual Activity  . Alcohol use: Yes    Alcohol/week: 0.0 oz    Comment: rare  .  Drug use: No  . Sexual activity: Yes    Birth control/protection: Surgical    Comment: HYST-1st intercourse 72 yo-Fewer than 5 partners  Lifestyle  . Physical activity:    Days per week: Not on file    Minutes per session: Not on file  . Stress: Not on file  Relationships  . Social connections:    Talks on phone: Not on file    Gets together: Not on file    Attends religious service: Not on file    Active member of club or organization: Not on file    Attends meetings of clubs or organizations: Not on file    Relationship status: Not on file  Other Topics Concern  . Not on file  Social History Narrative  . Not on file    Tobacco Counseling Counseling given: Not Answered   Activities of Daily Living In your  present state of health, do you have any difficulty performing the following activities: 08/30/2017 02/22/2017  Hearing? N N  Vision? N N  Difficulty concentrating or making decisions? N N  Walking or climbing stairs? N N  Dressing or bathing? N N  Doing errands, shopping? N N  Preparing Food and eating ? N -  Using the Toilet? N -  In the past six months, have you accidently leaked urine? N -  Do you have problems with loss of bowel control? N -  Managing your Medications? N -  Managing your Finances? N -  Housekeeping or managing your Housekeeping? N -  Some recent data might be hidden     Immunizations and Health Maintenance Immunization History  Administered Date(s) Administered  . Influenza,inj,Quad PF,6+ Mos 03/13/2016, 02/22/2017  . Pneumococcal Conjugate-13 12/04/2013  . Zoster 10/18/2010   Health Maintenance Due  Topic Date Due  . PNA vac Low Risk Adult (2 of 2 - PPSV23) 12/05/2014    Patient Care Team: Midge Minium, MD as PCP - General (Family Medicine) Harold Hedge, Darrick Grinder, MD as Consulting Physician (Allergy and Immunology) Phineas Real Belinda Block, MD as Consulting Physician (Gynecology) Juanita Craver, MD as Consulting Physician (Gastroenterology) [name], MD as Consulting Physician (Dermatology) Pedro Earls, MD as Attending Physician (Family Medicine) Mariea Clonts (Dentistry)  Indicate any recent Medical Services you may have received from other than [facility] providers in the past year (date may be approximate).     Assessment:   This is a routine wellness examination for [place].  Hearing/Vision screen Hearing Screening Comments: Able to hear conversational tones w/o difficulty. No issues reported.   Vision Screening Comments: Last exam < 6 months. Dr. Claudean Kinds. Yearly. Wears glasses.   Dietary issues and exercise activities discussed: Current Exercise Habits: Home exercise routine(Maintains house, gardening), Type of exercise: walking, Time (Minutes):  40, Frequency (Times/Week): 3, Weekly Exercise (Minutes/Week): 120, Exercise limited by: None identified   Diet (meal preparation, eat out, water intake, caffeinated beverages, dairy products, fruits and vegetables): Drinks water, milk and tea.   Breakfast: Hot tea, cereal/fruit; english muffin Lunch: lean cuisines Dinner: protein/vegetables.    Goals    . Weight (lb) < 140 lb (63.5 kg)     Lose weight by decreasing caloric intake and staying active.       Depression Screen PHQ 2/9 Scores 08/30/2017 02/22/2017  PHQ - 2 Score 0 0  PHQ- 9 Score - 0    Fall Risk Fall Risk  08/30/2017 02/22/2017  Falls in the past year? No No     Cognitive Function:  Ad8 score reviewed for issues:  Issues making decisions: no  Less interest in hobbies / activities: no  Repeats questions, stories (family complaining): no  Trouble using ordinary gadgets (microwave, computer, phone): no  Forgets the month or year: no  Mismanaging finances: no  Remembering appts: no  Daily problems with thinking and/or memory: no Ad8 score is=0     Screening Tests Health Maintenance  Topic Date Due  . PNA vac Low Risk Adult (2 of 2 - PPSV23) 12/05/2014  . Hepatitis C Screening  08/31/2018 (Originally [birth year])  . MAMMOGRAM  10/03/2018  . TETANUS/TDAP  08/28/2020  . COLONOSCOPY  02/22/2021  . INFLUENZA VACCINE  Completed  . DEXA SCAN  Completed       Plan:     Shingles vaccine at pharmacy.   Schedule bone scan with mammogram.   Continue doing brain stimulating activities (puzzles, reading, adult coloring books, staying active) to keep memory sharp.   Bring a copy of your living will and/or healthcare power of attorney to your next office visit.  I have personally reviewed and noted the following in the patient's chart:   . Medical and social history . Use of alcohol, tobacco or illicit drugs  . Current medications and supplements . Functional ability and status . Nutritional  status . Physical activity . Advanced directives . List of other physicians . Hospitalizations, surgeries, and ER visits in previous 12 months . Vitals . Screenings to include cognitive, depression, and falls . Referrals and appointments  In addition, I have reviewed and discussed with patient certain preventive protocols, quality metrics, and best practice recommendations. A written personalized care plan for preventive services as well as general preventive health recommendations were provided to patient.     Gerilyn Nestle, RN   08/30/2017    PCP Notes: -Next f/u with PCP 02/2018

## 2017-09-18 ENCOUNTER — Telehealth: Payer: Self-pay | Admitting: *Deleted

## 2017-09-18 ENCOUNTER — Encounter: Payer: Self-pay | Admitting: Gynecology

## 2017-09-18 ENCOUNTER — Ambulatory Visit (INDEPENDENT_AMBULATORY_CARE_PROVIDER_SITE_OTHER): Payer: Medicare Other | Admitting: Gynecology

## 2017-09-18 VITALS — BP 116/74 | Ht 62.0 in | Wt 147.0 lb

## 2017-09-18 DIAGNOSIS — Z01411 Encounter for gynecological examination (general) (routine) with abnormal findings: Secondary | ICD-10-CM | POA: Diagnosis not present

## 2017-09-18 DIAGNOSIS — N952 Postmenopausal atrophic vaginitis: Secondary | ICD-10-CM

## 2017-09-18 MED ORDER — NONFORMULARY OR COMPOUNDED ITEM: 3 refills | Status: DC

## 2017-09-18 NOTE — Progress Notes (Signed)
    DEKOTA KIRLIN February 16, 1946 [id number]        72 y.o.  [id number] for breast and pelvic exam.  Past medical history,surgical history, problem list, medications, allergies, family history and social history were all reviewed and documented as reviewed in the EPIC chart.  ROS:  Performed with pertinent positives and negatives included in the history, assessment and plan.   Additional significant findings : None   Exam: Caryn Bee assistant Vitals:   09/18/17 1027  BP: 116/74  Weight: 147 lb (66.7 kg)  Height: 5\' 2"  (1.575 m)   Body mass index is 26.89 kg/m.  General appearance:  Normal affect, orientation and appearance. Skin: Grossly normal HEENT: Without gross lesions.  No cervical or supraclavicular adenopathy. Thyroid normal.  Lungs:  Clear without wheezing, rales or rhonchi Cardiac: RR, without RMG Abdominal:  Soft, nontender, without masses, guarding, rebound, organomegaly or hernia Breasts:  Examined lying and sitting without masses, retractions, discharge or axillary adenopathy. Pelvic:  Ext, BUS, Vagina: With atrophic changes  Adnexa: Without masses or tenderness    Anus and perineum: Normal   Rectovaginal: Normal sphincter tone without palpated masses or tenderness.    Assessment/Plan:  72 y.o. [id number] female for breast and pelvic exam.  Status post TAH/BSO for endometriosis 1987.   1. Postmenopausal/atrophic genital changes.  Was using Femring but due to the cost switched over to Estrace vaginal cream twice weekly.  Is doing well with this.  Is not having systemic symptoms such as hot flushes or sweats.  We discussed the risks previously of absorption to include thrombosis and breast cancer.  Patient wants to continue.  I discussed alternative choices to include formulated vaginal estradiol cream through [facility] and she wants to go ahead and do this.  Annual prescription called. 2. Mammography coming due and patient will schedule.  Breast exam normal  today. 3. DEXA 2016 normal.  Recommend repeat DEXA at 5-year interval. 4. Colonoscopy 2012.  Repeat at their recommended interval. 5. Pap smear 2017.  No Pap smear done today.  Options to stop screening per current screening guidelines based on age and hysterectomy history reviewed.  Will readdress on an annual basis. 6. Health maintenance.  No routine lab work done as patient does this elsewhere.  Follow-up 1 year, sooner as needed.   Anastasio Auerbach MD, 10:48 AM 09/18/2017

## 2017-09-18 NOTE — Patient Instructions (Signed)
Start on the new vaginal estrogen cream through [facility].  Call if you have any issues with this.

## 2017-09-18 NOTE — Telephone Encounter (Signed)
Rx called in pt informed,

## 2017-09-18 NOTE — Telephone Encounter (Signed)
-----   Message from Anastasio Auerbach, MD sent at 09/18/2017 10:48 AM EDT ----- Call into [facility] vaginal estradiol cream twice weekly prefilled syringes 30-month supply refill times 1 year

## 2017-10-04 DIAGNOSIS — Z1231 Encounter for screening mammogram for malignant neoplasm of breast: Secondary | ICD-10-CM | POA: Diagnosis not present

## 2017-10-17 DIAGNOSIS — L301 Dyshidrosis [pompholyx]: Secondary | ICD-10-CM | POA: Diagnosis not present

## 2017-10-17 DIAGNOSIS — L570 Actinic keratosis: Secondary | ICD-10-CM | POA: Diagnosis not present

## 2017-10-17 DIAGNOSIS — L738 Other specified follicular disorders: Secondary | ICD-10-CM | POA: Diagnosis not present

## 2017-12-25 ENCOUNTER — Ambulatory Visit (INDEPENDENT_AMBULATORY_CARE_PROVIDER_SITE_OTHER): Payer: Medicare Other | Admitting: General Practice

## 2017-12-25 DIAGNOSIS — Z23 Encounter for immunization: Secondary | ICD-10-CM

## 2017-12-25 NOTE — Progress Notes (Signed)
Katherine Blake is a 72 y.o. female presents to the office today for Tdap injections, per physician orders. Original order: Tdap (pt aware she may receive a bill for injection, need for Grandbaby.  Boostrix (med), 0.8mL  (dose),  IM (route) was administered Right Deltoid (location) today. Patient tolerated injection. Patient due for follow up labs/provider appt: No. Date due:  appt made No   Khristin Keleher L Jaycelyn Orrison

## 2018-02-12 DIAGNOSIS — Z23 Encounter for immunization: Secondary | ICD-10-CM | POA: Diagnosis not present

## 2018-02-28 ENCOUNTER — Ambulatory Visit: Payer: Medicare Other | Admitting: Family Medicine

## 2018-03-04 ENCOUNTER — Other Ambulatory Visit: Payer: Self-pay

## 2018-03-04 ENCOUNTER — Ambulatory Visit (INDEPENDENT_AMBULATORY_CARE_PROVIDER_SITE_OTHER): Payer: Medicare Other | Admitting: Family Medicine

## 2018-03-04 ENCOUNTER — Encounter: Payer: Self-pay | Admitting: Family Medicine

## 2018-03-04 VITALS — BP 121/82 | HR 75 | Temp 98.2°F | Resp 17 | Ht 62.0 in | Wt 148.1 lb

## 2018-03-04 DIAGNOSIS — E785 Hyperlipidemia, unspecified: Secondary | ICD-10-CM | POA: Diagnosis not present

## 2018-03-04 DIAGNOSIS — E663 Overweight: Secondary | ICD-10-CM

## 2018-03-04 LAB — LIPID PANEL
CHOLESTEROL: 207 mg/dL — AB (ref 0–200)
HDL: 65.3 mg/dL (ref >39.00)
LDL CALC: 105 mg/dL — AB (ref 0–99)
NONHDL: 141.75
Total CHOL/HDL Ratio: 3
Triglycerides: 183 mg/dL — ABNORMAL HIGH (ref 0.0–149.0)
VLDL: 36.6 mg/dL (ref 0.0–40.0)

## 2018-03-04 LAB — BASIC METABOLIC PANEL
BUN: 20 mg/dL (ref 6–23)
CALCIUM: 9.8 mg/dL (ref 8.4–10.5)
CO2: 31 mEq/L (ref 19–32)
Chloride: 103 mEq/L (ref 96–112)
Creatinine, Ser: 0.63 mg/dL (ref 0.40–1.20)
GFR: 99.15 mL/min (ref >60.00)
GLUCOSE: 89 mg/dL (ref 70–99)
Potassium: 4.5 mEq/L (ref 3.5–5.1)
SODIUM: 140 meq/L (ref 135–145)

## 2018-03-04 LAB — CBC WITH DIFFERENTIAL/PLATELET
BASOS ABS: 0 10*3/uL (ref 0.0–0.1)
BASOS PCT: 0.6 % (ref 0.0–3.0)
EOS PCT: 3 % (ref 0.0–5.0)
Eosinophils Absolute: 0.2 10*3/uL (ref 0.0–0.7)
HEMATOCRIT: 37 % (ref 36.0–46.0)
Hemoglobin: 12.4 g/dL (ref 12.0–15.0)
LYMPHS ABS: 1.6 10*3/uL (ref 0.7–4.0)
LYMPHS PCT: 31.7 % (ref 12.0–46.0)
MCHC: 33.4 g/dL (ref 30.0–36.0)
MCV: 87.3 fl (ref 78.0–100.0)
MONOS PCT: 6.8 % (ref 3.0–12.0)
Monocytes Absolute: 0.3 10*3/uL (ref 0.1–1.0)
NEUTROS ABS: 3 10*3/uL (ref 1.4–7.7)
NEUTROS PCT: 57.9 % (ref 43.0–77.0)
PLATELETS: 292 10*3/uL (ref 150.0–400.0)
RBC: 4.24 Mil/uL (ref 3.87–5.11)
RDW: 13.6 % (ref 11.5–15.5)
WBC: 5.1 10*3/uL (ref 4.0–10.5)

## 2018-03-04 LAB — HEPATIC FUNCTION PANEL
ALT: 13 U/L (ref 0–35)
AST: 15 U/L (ref 0–37)
Albumin: 4.3 g/dL (ref 3.5–5.2)
Alkaline Phosphatase: 55 U/L (ref 39–117)
BILIRUBIN DIRECT: 0.1 mg/dL (ref 0.0–0.3)
BILIRUBIN TOTAL: 0.4 mg/dL (ref 0.2–1.2)
Total Protein: 6.7 g/dL (ref 6.0–8.3)

## 2018-03-04 LAB — TSH: TSH: 2.44 u[IU]/mL (ref 0.35–4.50)

## 2018-03-04 NOTE — Patient Instructions (Signed)
Follow up in 1 year to recheck cholesterol We'll notify you of your lab results and make any changes if needed Keep up the good work on healthy diet and regular exercise- you look great! Call with any questions or concerns Happy Fall!!

## 2018-03-04 NOTE — Assessment & Plan Note (Signed)
Chronic problem.  Has been able to control w/ diet and exercise.  Check labs to determine if medication is needed.

## 2018-03-04 NOTE — Progress Notes (Signed)
   Subjective:    Patient ID: Katherine Blake, female    DOB: May 20, 1946, 72 y.o.   MRN: [id number]  HPI Hyperlipidemia- pt has been able to successfully control w/ diet and exercise.  Weight is stable- BMI 27.1.  Pt reports feeling good.  No CP, SOB, HAs, visual changes, abd pain, N/V.   Review of Systems For ROS see HPI     Objective:   Physical Exam  Constitutional: She is oriented to person, place, and time. She appears well-developed and well-nourished. No distress.  HENT:  Head: Normocephalic and atraumatic.  Eyes: Pupils are equal, round, and reactive to light. Conjunctivae and EOM are normal.  Neck: Normal range of motion. Neck supple. No thyromegaly present.  Cardiovascular: Normal rate, regular rhythm, normal heart sounds and intact distal pulses.  No murmur heard. Pulmonary/Chest: Effort normal and breath sounds normal. No respiratory distress.  Abdominal: Soft. She exhibits no distension. There is no tenderness.  Musculoskeletal: She exhibits no edema.  Lymphadenopathy:    She has no cervical adenopathy.  Neurological: She is alert and oriented to person, place, and time.  Skin: Skin is warm and dry.  Psychiatric: She has a normal mood and affect. Her behavior is normal.  Vitals reviewed.         Assessment & Plan:

## 2018-03-04 NOTE — Assessment & Plan Note (Signed)
Ongoing issue.  BMI is stable at 27.1  Asymptomatic.  Check labs to risk stratify.  Will follow.

## 2018-03-05 ENCOUNTER — Encounter: Payer: Self-pay | Admitting: General Practice

## 2018-09-05 ENCOUNTER — Ambulatory Visit: Payer: Medicare Other

## 2018-09-18 ENCOUNTER — Ambulatory Visit: Payer: Medicare Other

## 2018-09-23 ENCOUNTER — Encounter: Payer: Medicare Other | Admitting: Gynecology

## 2018-11-05 ENCOUNTER — Other Ambulatory Visit: Payer: Self-pay

## 2018-11-05 MED ORDER — NONFORMULARY OR COMPOUNDED ITEM: 0 refills | Status: DC

## 2018-11-11 NOTE — Progress Notes (Addendum)
Subjective:   Katherine Blake is a 73 y.o. female who presents for Medicare Annual (Subsequent) preventive examination.  Review of Systems:  No ROS.  Medicare Wellness Virtual Visit.  Visual/audio telehealth visit.  See social history for additional risk factors.    Cardiac Risk Factors include: advanced age (>54men, >45 women);family history of premature cardiovascular disease   Sleep patterns: Sleeps well.  Home Safety/Smoke Alarms: Feels safe in home. Smoke alarms in place.  Living environment; residence and Firearm Safety: Lives with husband in 2 story home.  Seat Belt Safety/Bike Helmet: Wears seat belt.   Female:   [facility] ([facility]) Mammo-10/04/2017, benign. Appt w/GYN next week to schedule.     Dexa scan-05/10/2015, normal.  Will f/u with GYN if needed.       [facility]-Colonoscopy 02/23/2011, normal.      Objective:     Vitals: BP 111/62   Pulse 73   Temp 98.6 F (37 C)   Ht 5\' 2"  (1.575 m)   Wt 142 lb 6 oz (64.6 kg)   SpO2 97%   BMI 26.04 kg/m   Body mass index is 26.04 kg/m.  Advanced Directives 11/13/2018 08/30/2017  Does Patient Have a Medical Advance Directive? Yes Yes  Type of Paramedic of [place];Living will [place];Living will  Copy of [place] in Chart? Yes - validated most recent copy scanned in chart (See row information) No - copy requested    Tobacco Social History   Tobacco Use  Smoking Status Never Smoker  Smokeless Tobacco Never Used     Counseling given: Not Answered   Past Medical History:  Diagnosis Date  . Allergy   . History of chicken pox    Past Surgical History:  Procedure Laterality Date  . ABDOMINAL HYSTERECTOMY  1987   TAH,BSO Endometriosis  . CESAREAN SECTION     X 2  . OOPHORECTOMY     BSO  . PELVIC LAPAROSCOPY  1980   Family History  Problem Relation Age of Onset  . Heart failure Father   . Cancer Maternal Grandmother        Leukemia  .  Dementia Mother   . CAD Mother   . Thrombocytopenia Mother   . Other Mother        JAK2 positive   Social History   Socioeconomic History  . Marital status: Married    Spouse name: Not on file  . Number of children: Not on file  . Years of education: Not on file  . Highest education level: Not on file  Occupational History  . Not on file  Social Needs  . Financial resource strain: Not on file  . Food insecurity:    Worry: Not on file    Inability: Not on file  . Transportation needs:    Medical: Not on file    Non-medical: Not on file  Tobacco Use  . Smoking status: Never Smoker  . Smokeless tobacco: Never Used  Substance and Sexual Activity  . Alcohol use: Yes    Alcohol/week: 0.0 standard drinks    Comment: rare  . Drug use: No  . Sexual activity: Yes    Birth control/protection: Surgical    Comment: HYST-1st intercourse 73 yo-Fewer than 5 partners  Lifestyle  . Physical activity:    Days per week: Not on file    Minutes per session: Not on file  . Stress: Not on file  Relationships  . Social connections:  Talks on phone: Not on file    Gets together: Not on file    Attends religious service: Not on file    Active member of club or organization: Not on file    Attends meetings of clubs or organizations: Not on file    Relationship status: Not on file  Other Topics Concern  . Not on file  Social History Narrative  . Not on file    Outpatient Encounter Medications as of 11/13/2018  Medication Sig  . Calcium Carbonate-Vitamin D (CALCIUM + D PO) Take by mouth.  . fluticasone (FLONASE) 50 MCG/ACT nasal spray One or 2 sprays into each nostril daily for allergy control  . Multiple Vitamins-Minerals (CENTRUM SILVER 50+WOMEN PO) Take by mouth.  . NONFORMULARY OR COMPOUNDED ITEM Estradiol vag cream 0.02% #64 prefilled applicators Insert applicatorful (1 gram)  twice weekly.   No facility-administered encounter medications on file as of 11/13/2018.     Activities  of Daily Living In your present state of health, do you have any difficulty performing the following activities: 11/13/2018 03/04/2018  Hearing? N N  Vision? N N  Difficulty concentrating or making decisions? N N  Walking or climbing stairs? N N  Dressing or bathing? N N  Doing errands, shopping? N N  Preparing Food and eating ? N -  Using the Toilet? N -  In the past six months, have you accidently leaked urine? N -  Do you have problems with loss of bowel control? N -  Managing your Medications? N -  Managing your Finances? N -  Housekeeping or managing your Housekeeping? N -  Some recent data might be hidden    Patient Care Team: Midge Minium, MD as PCP - General (Family Medicine) Harold Hedge, Darrick Grinder, MD as Consulting Physician (Allergy and Immunology) Phineas Real, Belinda Block, MD as Consulting Physician (Gynecology) Juanita Craver, MD as Consulting Physician (Gastroenterology) [name], MD as Consulting Physician (Dermatology) Pedro Earls, MD as Attending Physician (Family Medicine) Mariea Clonts (Dentistry)    Assessment:   This is a routine wellness examination for [place].  Exercise Activities and Dietary recommendations Current Exercise Habits: Home exercise routine, Type of exercise: walking(gardening), Exercise limited by: None identified   Diet (meal preparation, eat out, water intake, caffeinated beverages, dairy products, fruits and vegetables): Drinks water, tea.   Eats "healthy" majority of time, occasional sweets     Goals    . Patient Stated     Maintain current health by staying active.     . Weight (lb) < 140 lb (63.5 kg)     Lose weight by decreasing caloric intake and staying active.        Fall Risk Fall Risk  11/13/2018 03/04/2018 08/30/2017 02/22/2017  Falls in the past year? 1 No No No  Comment tripped over ivy outdorrs - - -  Number falls in past yr: 0 - - -  Injury with Fall? 0 - - -    Depression Screen PHQ 2/9 Scores 11/13/2018 03/04/2018  08/30/2017 02/22/2017  PHQ - 2 Score 0 0 0 0  PHQ- 9 Score - 0 - 0     Cognitive Function MMSE - Mini Mental State Exam 11/13/2018  Orientation to time 5  Orientation to Place 5  Registration 3  Attention/ Calculation 5  Recall 1  Language- name 2 objects 2  Language- repeat 1  Language- follow 3 step command 3  Language- read & follow direction 1  Write a sentence 1  Copy design 1  Total score 28        Immunization History  Administered Date(s) Administered  . Influenza, High Dose Seasonal PF 02/12/2015  . Influenza,inj,Quad PF,6+ Mos 03/13/2016, 02/22/2017, 02/01/2018  . Pneumococcal Conjugate-13 12/04/2013  . Pneumococcal Polysaccharide-23 08/30/2017  . Tdap 12/25/2017  . Zoster 10/18/2010   Shingrix Rx sent to pharmacy.   Screening Tests Health Maintenance  Topic Date Due  . Hepatitis C Screening  [birth year]  . INFLUENZA VACCINE  01/11/2019  . MAMMOGRAM  10/05/2019  . COLONOSCOPY  02/22/2021  . TETANUS/TDAP  12/26/2027  . DEXA SCAN  Completed  . PNA vac Low Risk Adult  Completed        Plan:    Shingles vaccine at pharmacy.   Schedule mammogram and bone scan with Dr. Phineas Real.   Continue doing brain stimulating activities (puzzles, reading, adult coloring books, staying active) to keep memory sharp.   I have personally reviewed and noted the following in the patient's chart:   . Medical and social history . Use of alcohol, tobacco or illicit drugs  . Current medications and supplements . Functional ability and status . Nutritional status . Physical activity . Advanced directives . List of other physicians . Hospitalizations, surgeries, and ER visits in previous 12 months . Vitals . Screenings to include cognitive, depression, and falls . Referrals and appointments  In addition, I have reviewed and discussed with patient certain preventive protocols, quality metrics, and best practice recommendations. A written personalized care plan for  preventive services as well as general preventive health recommendations were provided to patient.     Gerilyn Nestle, RN  11/13/2018  Reviewed documentation provided by RN and agree w/ above.  Annye Asa, MD

## 2018-11-13 ENCOUNTER — Ambulatory Visit (INDEPENDENT_AMBULATORY_CARE_PROVIDER_SITE_OTHER): Payer: Medicare Other

## 2018-11-13 ENCOUNTER — Ambulatory Visit: Payer: Medicare Other

## 2018-11-13 VITALS — BP 111/62 | HR 73 | Temp 98.6°F | Ht 62.0 in | Wt 142.4 lb

## 2018-11-13 DIAGNOSIS — Z23 Encounter for immunization: Secondary | ICD-10-CM | POA: Diagnosis not present

## 2018-11-13 DIAGNOSIS — Z0000 Encounter for general adult medical examination without abnormal findings: Secondary | ICD-10-CM | POA: Diagnosis not present

## 2018-11-13 MED ORDER — ZOSTER VAC RECOMB ADJUVANTED 50 MCG/0.5ML IM SUSR: 0.5000 mL | Freq: Once | INTRAMUSCULAR | 1 refills | Status: AC

## 2018-11-13 NOTE — Progress Notes (Signed)
  I connected with patient 11/13/18 at 10:00 AM EDT by a video/audio enabled telemedicine application and verified that I am speaking with the correct person using two identifiers. Patient stated full name and DOB. Patient gave permission to continue with virtual visit. Patient's location was at home and Nurse's location was at [place] office.

## 2018-11-13 NOTE — Patient Instructions (Addendum)
Shingles vaccine at pharmacy.   Schedule mammogram and bone scan with Dr. Phineas Real.   Continue doing brain stimulating activities (puzzles, reading, adult coloring books, staying active) to keep memory sharp.    Fall Prevention in the Home, Adult Falls can cause injuries. They can happen to people of all ages. There are many things you can do to make your home safe and to help prevent falls. Ask for help when making these changes, if needed. What actions can I take to prevent falls? General Instructions  Use good lighting in all rooms. Replace any light bulbs that burn out.  Turn on the lights when you go into a dark area. Use night-lights.  Keep items that you use often in easy-to-reach places. Lower the shelves around your home if necessary.  Set up your furniture so you have a clear path. Avoid moving your furniture around.  Do not have throw rugs and other things on the floor that can make you trip.  Avoid walking on wet floors.  If any of your floors are uneven, fix them.  Add color or contrast paint or tape to clearly mark and help you see: ? Any grab bars or handrails. ? First and last steps of stairways. ? Where the edge of each step is.  If you use a stepladder: ? Make sure that it is fully opened. Do not climb a closed stepladder. ? Make sure that both sides of the stepladder are locked into place. ? Ask someone to hold the stepladder for you while you use it.  If there are any pets around you, be aware of where they are. What can I do in the bathroom?      Keep the floor dry. Clean up any water that spills onto the floor as soon as it happens.  Remove soap buildup in the tub or shower regularly.  Use non-skid mats or decals on the floor of the tub or shower.  Attach bath mats securely with double-sided, non-slip rug tape.  If you need to sit down in the shower, use a plastic, non-slip stool.  Install grab bars by the toilet and in the tub and shower.  Do not use towel bars as grab bars. What can I do in the bedroom?  Make sure that you have a light by your bed that is easy to reach.  Do not use any sheets or blankets that are too big for your bed. They should not hang down onto the floor.  Have a firm chair that has side arms. You can use this for support while you get dressed. What can I do in the kitchen?  Clean up any spills right away.  If you need to reach something above you, use a strong step stool that has a grab bar.  Keep electrical cords out of the way.  Do not use floor polish or wax that makes floors slippery. If you must use wax, use non-skid floor wax. What can I do with my stairs?  Do not leave any items on the stairs.  Make sure that you have a light switch at the top of the stairs and the bottom of the stairs. If you do not have them, ask someone to add them for you.  Make sure that there are handrails on both sides of the stairs, and use them. Fix handrails that are broken or loose. Make sure that handrails are as long as the stairways.  Install non-slip stair treads on all stairs in  your home.  Avoid having throw rugs at the top or bottom of the stairs. If you do have throw rugs, attach them to the floor with carpet tape.  Choose a carpet that does not hide the edge of the steps on the stairway.  Check any carpeting to make sure that it is firmly attached to the stairs. Fix any carpet that is loose or worn. What can I do on the outside of my home?  Use bright outdoor lighting.  Regularly fix the edges of walkways and driveways and fix any cracks.  Remove anything that might make you trip as you walk through a door, such as a raised step or threshold.  Trim any bushes or trees on the path to your home.  Regularly check to see if handrails are loose or broken. Make sure that both sides of any steps have handrails.  Install guardrails along the edges of any raised decks and porches.  Clear walking  paths of anything that might make someone trip, such as tools or rocks.  Have any leaves, snow, or ice cleared regularly.  Use sand or salt on walking paths during winter.  Clean up any spills in your garage right away. This includes grease or oil spills. What other actions can I take?  Wear shoes that: ? Have a low heel. Do not wear high heels. ? Have rubber bottoms. ? Are comfortable and fit you well. ? Are closed at the toe. Do not wear open-toe sandals.  Use tools that help you move around (mobility aids) if they are needed. These include: ? Canes. ? Walkers. ? Scooters. ? Crutches.  Review your medicines with your doctor. Some medicines can make you feel dizzy. This can increase your chance of falling. Ask your doctor what other things you can do to help prevent falls. Where to find more information  Centers for Disease Control and Prevention, STEADI: https://garcia.biz/  [employer] on Aging: BrainJudge.co.uk Contact a doctor if:  You are afraid of falling at home.  You feel weak, drowsy, or dizzy at home.  You fall at home. Summary  There are many simple things that you can do to make your home safe and to help prevent falls.  Ways to make your home safe include removing tripping hazards and installing grab bars in the bathroom.  Ask for help when making these changes in your home. This information is not intended to replace advice given to you by your health care provider. Make sure you discuss any questions you have with your health care provider. Document Released: 03/25/2009 Document Revised: 01/11/2017 Document Reviewed: 01/11/2017 Elsevier Interactive Patient Education  2019 [place] Maintenance, Female Adopting a healthy lifestyle and getting preventive care can go a long way to promote health and wellness. Talk with your health care provider about what schedule of regular examinations is right for you. This is a good chance for  you to check in with your provider about disease prevention and staying healthy. In between checkups, there are plenty of things you can do on your own. Experts have done a lot of research about which lifestyle changes and preventive measures are most likely to keep you healthy. Ask your health care provider for more information. Weight and diet Eat a healthy diet  Be sure to include plenty of vegetables, fruits, low-fat dairy products, and lean protein.  Do not eat a lot of foods high in solid fats, added sugars, or salt.  Get regular exercise. This  is one of the most important things you can do for your health. ? Most adults should exercise for at least 150 minutes each week. The exercise should increase your heart rate and make you sweat (moderate-intensity exercise). ? Most adults should also do strengthening exercises at least twice a week. This is in addition to the moderate-intensity exercise. Maintain a healthy weight  Body mass index (BMI) is a measurement that can be used to identify possible weight problems. It estimates body fat based on height and weight. Your health care provider can help determine your BMI and help you achieve or maintain a healthy weight.  For females 18 years of age and older: ? A BMI below 18.5 is considered underweight. ? A BMI of 18.5 to 24.9 is normal. ? A BMI of 25 to 29.9 is considered overweight. ? A BMI of 30 and above is considered obese. Watch levels of cholesterol and blood lipids  You should start having your blood tested for lipids and cholesterol at 71 years of age, then have this test every 5 years.  You may need to have your cholesterol levels checked more often if: ? Your lipid or cholesterol levels are high. ? You are older than 71 years of age. ? You are at high risk for heart disease. Cancer screening Lung Cancer  Lung cancer screening is recommended for adults 70-62 years old who are at high risk for lung cancer because of a  history of smoking.  A yearly low-dose CT scan of the lungs is recommended for people who: ? Currently smoke. ? Have quit within the past 15 years. ? Have at least a 30-pack-year history of smoking. A pack year is smoking an average of one pack of cigarettes a day for 1 year.  Yearly screening should continue until it has been 15 years since you quit.  Yearly screening should stop if you develop a health problem that would prevent you from having lung cancer treatment. Breast Cancer  Practice breast self-awareness. This means understanding how your breasts normally appear and feel.  It also means doing regular breast self-exams. Let your health care provider know about any changes, no matter how small.  If you are in your 20s or 30s, you should have a clinical breast exam (CBE) by a health care provider every 1-3 years as part of a regular health exam.  If you are 29 or older, have a CBE every year. Also consider having a breast X-ray (mammogram) every year.  If you have a family history of breast cancer, talk to your health care provider about genetic screening.  If you are at high risk for breast cancer, talk to your health care provider about having an MRI and a mammogram every year.  Breast cancer gene (BRCA) assessment is recommended for women who have family members with BRCA-related cancers. BRCA-related cancers include: ? Breast. ? Ovarian. ? Tubal. ? Peritoneal cancers.  Results of the assessment will determine the need for genetic counseling and BRCA1 and BRCA2 testing. Cervical Cancer Your health care provider may recommend that you be screened regularly for cancer of the pelvic organs (ovaries, uterus, and vagina). This screening involves a pelvic examination, including checking for microscopic changes to the surface of your cervix (Pap test). You may be encouraged to have this screening done every 3 years, beginning at age 43.  For women ages 2-65, health care  providers may recommend pelvic exams and Pap testing every 3 years, or they may recommend the  Pap and pelvic exam, combined with testing for human papilloma virus (HPV), every 5 years. Some types of HPV increase your risk of cervical cancer. Testing for HPV may also be done on women of any age with unclear Pap test results.  Other health care providers may not recommend any screening for nonpregnant women who are considered low risk for pelvic cancer and who do not have symptoms. Ask your health care provider if a screening pelvic exam is right for you.  If you have had past treatment for cervical cancer or a condition that could lead to cancer, you need Pap tests and screening for cancer for at least 20 years after your treatment. If Pap tests have been discontinued, your risk factors (such as having a new sexual partner) need to be reassessed to determine if screening should resume. Some women have medical problems that increase the chance of getting cervical cancer. In these cases, your health care provider may recommend more frequent screening and Pap tests. Colorectal Cancer  This type of cancer can be detected and often prevented.  Routine colorectal cancer screening usually begins at 71 years of age and continues through 71 years of age.  Your health care provider may recommend screening at an earlier age if you have risk factors for colon cancer.  Your health care provider may also recommend using home test kits to check for hidden blood in the stool.  A small camera at the end of a tube can be used to examine your colon directly (sigmoidoscopy or colonoscopy). This is done to check for the earliest forms of colorectal cancer.  Routine screening usually begins at age 33.  Direct examination of the colon should be repeated every 5-10 years through 71 years of age. However, you may need to be screened more often if early forms of precancerous polyps or small growths are found. Skin Cancer   Check your skin from head to toe regularly.  Tell your health care provider about any new moles or changes in moles, especially if there is a change in a mole's shape or color.  Also tell your health care provider if you have a mole that is larger than the size of a pencil eraser.  Always use sunscreen. Apply sunscreen liberally and repeatedly throughout the day.  Protect yourself by wearing long sleeves, pants, a wide-brimmed hat, and sunglasses whenever you are outside. Heart disease, diabetes, and high blood pressure  High blood pressure causes heart disease and increases the risk of stroke. High blood pressure is more likely to develop in: ? People who have blood pressure in the high end of the normal range (130-139/85-89 mm Hg). ? People who are overweight or obese. ? People who are African American.  If you are 45-2 years of age, have your blood pressure checked every 3-5 years. If you are 17 years of age or older, have your blood pressure checked every year. You should have your blood pressure measured twice-once when you are at a hospital or clinic, and once when you are not at a hospital or clinic. Record the average of the two measurements. To check your blood pressure when you are not at a hospital or clinic, you can use: ? An automated blood pressure machine at a pharmacy. ? A home blood pressure monitor.  If you are between 67 years and 75 years old, ask your health care provider if you should take aspirin to prevent strokes.  Have regular diabetes screenings. This involves taking a  blood sample to check your fasting blood sugar level. ? If you are at a normal weight and have a low risk for diabetes, have this test once every three years after 71 years of age. ? If you are overweight and have a high risk for diabetes, consider being tested at a younger age or more often. Preventing infection Hepatitis B  If you have a higher risk for hepatitis B, you should be screened  for this virus. You are considered at high risk for hepatitis B if: ? You were born in a country where hepatitis B is common. Ask your health care provider which countries are considered high risk. ? Your parents were born in a high-risk country, and you have not been immunized against hepatitis B (hepatitis B vaccine). ? You have HIV or AIDS. ? You use needles to inject street drugs. ? You live with someone who has hepatitis B. ? You have had sex with someone who has hepatitis B. ? You get hemodialysis treatment. ? You take certain medicines for conditions, including cancer, organ transplantation, and autoimmune conditions. Hepatitis C  Blood testing is recommended for: ? Everyone born from [birth year] through 1963. ? Anyone with known risk factors for hepatitis C. Sexually transmitted infections (STIs)  You should be screened for sexually transmitted infections (STIs) including gonorrhea and chlamydia if: ? You are sexually active and are younger than 71 years of age. ? You are older than 71 years of age and your health care provider tells you that you are at risk for this type of infection. ? Your sexual activity has changed since you were last screened and you are at an increased risk for chlamydia or gonorrhea. Ask your health care provider if you are at risk.  If you do not have HIV, but are at risk, it may be recommended that you take a prescription medicine daily to prevent HIV infection. This is called pre-exposure prophylaxis (PrEP). You are considered at risk if: ? You are sexually active and do not regularly use condoms or know the HIV status of your partner(s). ? You take drugs by injection. ? You are sexually active with a partner who has HIV. Talk with your health care provider about whether you are at high risk of being infected with HIV. If you choose to begin PrEP, you should first be tested for HIV. You should then be tested every 3 months for as long as you are taking PrEP.  Pregnancy  If you are premenopausal and you may become pregnant, ask your health care provider about preconception counseling.  If you may become pregnant, take 400 to 800 micrograms (mcg) of folic acid every day.  If you want to prevent pregnancy, talk to your health care provider about birth control (contraception). Osteoporosis and menopause  Osteoporosis is a disease in which the bones lose minerals and strength with aging. This can result in serious bone fractures. Your risk for osteoporosis can be identified using a bone density scan.  If you are 29 years of age or older, or if you are at risk for osteoporosis and fractures, ask your health care provider if you should be screened.  Ask your health care provider whether you should take a calcium or vitamin D supplement to lower your risk for osteoporosis.  Menopause may have certain physical symptoms and risks.  Hormone replacement therapy may reduce some of these symptoms and risks. Talk to your health care provider about whether hormone replacement therapy is right for  you. Follow these instructions at home:  Schedule regular health, dental, and eye exams.  Stay current with your immunizations.  Do not use any tobacco products including cigarettes, chewing tobacco, or electronic cigarettes.  If you are pregnant, do not drink alcohol.  If you are breastfeeding, limit how much and how often you drink alcohol.  Limit alcohol intake to no more than 1 drink per day for nonpregnant women. One drink equals 12 ounces of beer, 5 ounces of wine, or 1 ounces of hard liquor.  Do not use street drugs.  Do not share needles.  Ask your health care provider for help if you need support or information about quitting drugs.  Tell your health care provider if you often feel depressed.  Tell your health care provider if you have ever been abused or do not feel safe at home. This information is not intended to replace advice given to  you by your health care provider. Make sure you discuss any questions you have with your health care provider. Document Released: 12/12/2010 Document Revised: 11/04/2015 Document Reviewed: 03/02/2015 Elsevier Interactive Patient Education  2019 [employer].

## 2018-11-18 ENCOUNTER — Other Ambulatory Visit: Payer: Self-pay

## 2018-11-19 ENCOUNTER — Ambulatory Visit (INDEPENDENT_AMBULATORY_CARE_PROVIDER_SITE_OTHER): Payer: Medicare Other | Admitting: Gynecology

## 2018-11-19 ENCOUNTER — Encounter: Payer: Self-pay | Admitting: Gynecology

## 2018-11-19 VITALS — BP 118/76 | Ht 62.0 in | Wt 145.0 lb

## 2018-11-19 DIAGNOSIS — Z01419 Encounter for gynecological examination (general) (routine) without abnormal findings: Secondary | ICD-10-CM | POA: Diagnosis not present

## 2018-11-19 DIAGNOSIS — Z1272 Encounter for screening for malignant neoplasm of vagina: Secondary | ICD-10-CM

## 2018-11-19 DIAGNOSIS — N952 Postmenopausal atrophic vaginitis: Secondary | ICD-10-CM

## 2018-11-19 NOTE — Patient Instructions (Signed)
Follow-up in 1 year for annual exam, sooner if any issues. 

## 2018-11-19 NOTE — Progress Notes (Signed)
    FEY COGHILL 1945-11-18 [id number]        73 y.o.  [id number] for breast and pelvic exam.  Without gynecologic complaints.  Continues on estradiol vaginal cream twice weekly with good results.  Past medical history,surgical history, problem list, medications, allergies, family history and social history were all reviewed and documented as reviewed in the EPIC chart.  ROS:  Performed with pertinent positives and negatives included in the history, assessment and plan.   Additional significant findings : None   Exam: Caryn Bee assistant Vitals:   11/19/18 0950  BP: 118/76  Weight: 145 lb (65.8 kg)  Height: 5\' 2"  (1.575 m)   Body mass index is 26.52 kg/m.  General appearance:  Normal affect, orientation and appearance. Skin: Grossly normal HEENT: Without gross lesions.  No cervical or supraclavicular adenopathy. Thyroid normal.  Lungs:  Clear without wheezing, rales or rhonchi Cardiac: RR, without RMG Abdominal:  Soft, nontender, without masses, guarding, rebound, organomegaly or hernia Breasts:  Examined lying and sitting without masses, retractions, discharge or axillary adenopathy. Pelvic:  Ext, BUS, Vagina: With atrophic changes.  Pap smear done  Adnexa: Without masses or tenderness    Anus and perineum: Normal   Rectovaginal: Normal sphincter tone without palpated masses or tenderness.    Assessment/Plan:  73 y.o. [id number] female for breast and pelvic exam.  History of TAH/BSO for endometriosis 1987.  1. Postmenopausal/atrophic genital changes.  On estradiol vaginal cream twice weekly through [place].  Doing well with this and wants to continue.  We have discussed the absorption risks in the past.  Refill x1 year provided. 2. Mammography scheduled end of this month.  Breast exam normal today. 3. DEXA 2016 normal.  Plan repeat DEXA next year at 5-year interval. 4. Colonoscopy 2012.  Repeat at their recommended interval. 5. Pap smear 2017.  No history of  abnormal Pap smears.  Reviewed current screening guidelines.  Patient uncomfortable with stop screening.  Pap smear done today. 6. Health maintenance.  No routine lab work done as patient does this elsewhere.  Follow-up 1 year, sooner as needed.   Anastasio Auerbach MD, 10:17 AM 11/19/2018

## 2018-11-19 NOTE — Addendum Note (Signed)
Addended by: Nelva Nay on: 11/19/2018 10:56 AM   Modules accepted: Orders

## 2018-11-20 LAB — PAP IG W/ RFLX HPV ASCU

## 2018-12-02 DIAGNOSIS — Z1231 Encounter for screening mammogram for malignant neoplasm of breast: Secondary | ICD-10-CM | POA: Diagnosis not present

## 2018-12-02 LAB — HM MAMMOGRAPHY

## 2018-12-03 DIAGNOSIS — H5213 Myopia, bilateral: Secondary | ICD-10-CM | POA: Diagnosis not present

## 2018-12-03 DIAGNOSIS — H43813 Vitreous degeneration, bilateral: Secondary | ICD-10-CM | POA: Diagnosis not present

## 2018-12-03 DIAGNOSIS — H52203 Unspecified astigmatism, bilateral: Secondary | ICD-10-CM | POA: Diagnosis not present

## 2018-12-06 ENCOUNTER — Encounter: Payer: Self-pay | Admitting: General Practice

## 2019-01-20 ENCOUNTER — Other Ambulatory Visit: Payer: Self-pay

## 2019-01-20 MED ORDER — NONFORMULARY OR COMPOUNDED ITEM: 4 refills | Status: DC

## 2019-03-02 DIAGNOSIS — Z23 Encounter for immunization: Secondary | ICD-10-CM | POA: Diagnosis not present

## 2019-03-06 ENCOUNTER — Ambulatory Visit: Payer: Medicare Other | Admitting: Family Medicine

## 2019-03-19 ENCOUNTER — Encounter: Payer: Self-pay | Admitting: Gynecology

## 2019-04-08 DIAGNOSIS — L739 Follicular disorder, unspecified: Secondary | ICD-10-CM | POA: Diagnosis not present

## 2019-04-08 DIAGNOSIS — L570 Actinic keratosis: Secondary | ICD-10-CM | POA: Diagnosis not present

## 2019-04-29 ENCOUNTER — Other Ambulatory Visit: Payer: Self-pay

## 2019-04-29 DIAGNOSIS — Z20822 Contact with and (suspected) exposure to covid-19: Secondary | ICD-10-CM

## 2019-04-29 DIAGNOSIS — Z20828 Contact with and (suspected) exposure to other viral communicable diseases: Secondary | ICD-10-CM | POA: Diagnosis not present

## 2019-05-01 LAB — NOVEL CORONAVIRUS, NAA: SARS-CoV-2, NAA: NOT DETECTED

## 2019-05-06 ENCOUNTER — Encounter: Payer: Medicare Other | Admitting: Family Medicine

## 2019-05-20 ENCOUNTER — Other Ambulatory Visit: Payer: Self-pay

## 2019-05-21 ENCOUNTER — Encounter: Payer: Self-pay | Admitting: Family Medicine

## 2019-05-21 ENCOUNTER — Ambulatory Visit (INDEPENDENT_AMBULATORY_CARE_PROVIDER_SITE_OTHER): Payer: Medicare Other | Admitting: Family Medicine

## 2019-05-21 VITALS — BP 112/72 | HR 78 | Temp 97.9°F | Resp 16 | Ht 62.0 in | Wt 140.2 lb

## 2019-05-21 DIAGNOSIS — E663 Overweight: Secondary | ICD-10-CM | POA: Diagnosis not present

## 2019-05-21 DIAGNOSIS — E785 Hyperlipidemia, unspecified: Secondary | ICD-10-CM

## 2019-05-21 LAB — CBC WITH DIFFERENTIAL/PLATELET
Basophils Absolute: 0.1 10*3/uL (ref 0.0–0.1)
Basophils Relative: 1.2 % (ref 0.0–3.0)
Eosinophils Absolute: 0.1 10*3/uL (ref 0.0–0.7)
Eosinophils Relative: 2.6 % (ref 0.0–5.0)
HCT: 39.4 % (ref 36.0–46.0)
Hemoglobin: 13 g/dL (ref 12.0–15.0)
Lymphocytes Relative: 33.8 % (ref 12.0–46.0)
Lymphs Abs: 1.6 10*3/uL (ref 0.7–4.0)
MCHC: 32.9 g/dL (ref 30.0–36.0)
MCV: 88.3 fl (ref 78.0–100.0)
Monocytes Absolute: 0.4 10*3/uL (ref 0.1–1.0)
Monocytes Relative: 7.3 % (ref 3.0–12.0)
Neutro Abs: 2.7 10*3/uL (ref 1.4–7.7)
Neutrophils Relative %: 55.1 % (ref 43.0–77.0)
Platelets: 297 10*3/uL (ref 150.0–400.0)
RBC: 4.47 Mil/uL (ref 3.87–5.11)
RDW: 13.3 % (ref 11.5–15.5)
WBC: 4.9 10*3/uL (ref 4.0–10.5)

## 2019-05-21 LAB — HEPATIC FUNCTION PANEL
ALT: 16 U/L (ref 0–35)
AST: 18 U/L (ref 0–37)
Albumin: 4.4 g/dL (ref 3.5–5.2)
Alkaline Phosphatase: 59 U/L (ref 39–117)
Bilirubin, Direct: 0.1 mg/dL (ref 0.0–0.3)
Total Bilirubin: 0.7 mg/dL (ref 0.2–1.2)
Total Protein: 6.7 g/dL (ref 6.0–8.3)

## 2019-05-21 LAB — LIPID PANEL
Cholesterol: 222 mg/dL — ABNORMAL HIGH (ref 0–200)
HDL: 69.6 mg/dL (ref >39.00)
LDL Cholesterol: 135 mg/dL — ABNORMAL HIGH (ref 0–99)
NonHDL: 152.14
Total CHOL/HDL Ratio: 3
Triglycerides: 87 mg/dL (ref 0.0–149.0)
VLDL: 17.4 mg/dL (ref 0.0–40.0)

## 2019-05-21 LAB — TSH: TSH: 2.31 u[IU]/mL (ref 0.35–4.50)

## 2019-05-21 LAB — BASIC METABOLIC PANEL
BUN: 17 mg/dL (ref 6–23)
CO2: 29 mEq/L (ref 19–32)
Calcium: 9.5 mg/dL (ref 8.4–10.5)
Chloride: 103 mEq/L (ref 96–112)
Creatinine, Ser: 0.61 mg/dL (ref 0.40–1.20)
GFR: 96.49 mL/min (ref >60.00)
Glucose, Bld: 90 mg/dL (ref 70–99)
Potassium: 4.3 mEq/L (ref 3.5–5.1)
Sodium: 137 mEq/L (ref 135–145)

## 2019-05-21 NOTE — Patient Instructions (Signed)
Follow up in 1 year or as needed We'll notify you of your lab results and make any changes if needed Continue to work on healthy diet and regular exercise- you look great! Call with any questions or concerns Stay Safe!  Stay Healthy! Merry Christmas!

## 2019-05-21 NOTE — Progress Notes (Signed)
   Subjective:    Patient ID: Katherine Blake, female    DOB: February 09, 1946, 73 y.o.   MRN: [id number]  HPI Hyperlipidemia- pt has been able to control w/ diet and exercise.  No CP, SOB, HAs, abd pain, N/V.  Overweight- pt has lost 5 lbs since last visit and BMI is now 25.65  Pt reports she has been very limited on snacking.  Continues to exercise regularly.     Review of Systems For ROS see HPI   This visit occurred during the SARS-CoV-2 public health emergency.  Safety protocols were in place, including screening questions prior to the visit, additional usage of staff PPE, and extensive cleaning of exam room while observing appropriate contact time as indicated for disinfecting solutions.       Objective:   Physical Exam Vitals signs reviewed.  Constitutional:      General: She is not in acute distress.    Appearance: She is well-developed.  HENT:     Head: Normocephalic and atraumatic.  Eyes:     Conjunctiva/sclera: Conjunctivae normal.     Pupils: Pupils are equal, round, and reactive to light.  Neck:     Musculoskeletal: Normal range of motion and neck supple.     Thyroid: No thyromegaly.  Cardiovascular:     Rate and Rhythm: Normal rate and regular rhythm.     Heart sounds: Normal heart sounds. No murmur.  Pulmonary:     Effort: Pulmonary effort is normal. No respiratory distress.     Breath sounds: Normal breath sounds.  Abdominal:     General: There is no distension.     Palpations: Abdomen is soft.     Tenderness: There is no abdominal tenderness.  Lymphadenopathy:     Cervical: No cervical adenopathy.  Skin:    General: Skin is warm and dry.  Neurological:     Mental Status: She is alert and oriented to person, place, and time.  Psychiatric:        Behavior: Behavior normal.           Assessment & Plan:

## 2019-05-21 NOTE — Assessment & Plan Note (Signed)
Applauded her efforts at healthy diet and regular exercise.  Pt is down 5 lbs.  Check labs to risk stratify.  Will follow.

## 2019-05-21 NOTE — Assessment & Plan Note (Signed)
Chronic problem.  Has done a good job of controlling w/ diet and regular exercise.  Check labs to risk stratify.  Will follow.

## 2019-05-22 ENCOUNTER — Encounter: Payer: Self-pay | Admitting: General Practice

## 2019-07-03 ENCOUNTER — Ambulatory Visit: Payer: Medicare Other | Attending: Internal Medicine

## 2019-07-03 DIAGNOSIS — Z23 Encounter for immunization: Secondary | ICD-10-CM | POA: Insufficient documentation

## 2019-07-03 NOTE — Progress Notes (Signed)
   Covid-19 Vaccination Clinic  Name:  Katherine Blake    MRN: [id number] DOB: 26-Mar-1946  07/03/2019  [name] was observed post Covid-19 immunization for 15 minutes without incidence. She was provided with Vaccine Information Sheet and instruction to access the V-Safe system.   [name] was instructed to call 911 with any severe reactions post vaccine: Marland Kitchen Difficulty breathing  . Swelling of your face and throat  . A fast heartbeat  . A bad rash all over your body  . Dizziness and weakness    Immunizations Administered    Name Date Dose VIS Date Route   Pfizer COVID-19 Vaccine 07/03/2019  9:55 AM 0.3 mL 05/23/2019 Intramuscular   Manufacturer: [place]   Lot: [id number]   [place]: [id number]

## 2019-07-16 ENCOUNTER — Encounter: Payer: Self-pay | Admitting: Family Medicine

## 2019-07-22 ENCOUNTER — Ambulatory Visit: Payer: Medicare Other | Attending: Internal Medicine

## 2019-07-22 DIAGNOSIS — Z23 Encounter for immunization: Secondary | ICD-10-CM | POA: Insufficient documentation

## 2019-07-22 NOTE — Progress Notes (Signed)
   Covid-19 Vaccination Clinic  Name:  Katherine Blake    MRN: [id number] DOB: 1945/07/09  07/22/2019  Katherine Blake was observed post Covid-19 immunization for 15 minutes without incidence. She was provided with Vaccine Information Sheet and instruction to access the V-Safe system.   Katherine Blake was instructed to call 911 with any severe reactions post vaccine: Marland Kitchen Difficulty breathing  . Swelling of your face and throat  . A fast heartbeat  . A bad rash all over your body  . Dizziness and weakness    Immunizations Administered    Name Date Dose VIS Date Route   Pfizer COVID-19 Vaccine 07/22/2019  8:21 AM 0.3 mL 05/23/2019 Intramuscular   Manufacturer: [place]   Lot: [id number]   [place]: [id number]

## 2019-08-04 DIAGNOSIS — Z1211 Encounter for screening for malignant neoplasm of colon: Secondary | ICD-10-CM | POA: Diagnosis not present

## 2019-08-04 LAB — COLOGUARD: Cologuard: NEGATIVE

## 2019-08-07 LAB — COLOGUARD: COLOGUARD: NEGATIVE

## 2019-08-12 ENCOUNTER — Encounter: Payer: Self-pay | Admitting: Emergency Medicine

## 2019-08-27 ENCOUNTER — Ambulatory Visit: Payer: Medicare Other | Admitting: Family Medicine

## 2019-08-27 ENCOUNTER — Other Ambulatory Visit: Payer: Self-pay

## 2019-08-27 ENCOUNTER — Encounter: Payer: Self-pay | Admitting: Family Medicine

## 2019-08-27 ENCOUNTER — Telehealth (INDEPENDENT_AMBULATORY_CARE_PROVIDER_SITE_OTHER): Payer: Medicare Other | Admitting: Family Medicine

## 2019-08-27 DIAGNOSIS — M19049 Primary osteoarthritis, unspecified hand: Secondary | ICD-10-CM

## 2019-08-27 MED ORDER — PREDNISONE 10 MG PO TABS: ORAL_TABLET | ORAL | 0 refills | Status: DC

## 2019-08-27 NOTE — Progress Notes (Signed)
   Virtual Visit via Video   I connected with patient on 08/27/19 at  9:30 AM EDT by a video enabled telemedicine application and verified that I am speaking with the correct person using two identifiers.  Location patient: Home Location provider: [facility]up[facility], Office Persons participating in the virtual visit: Patient, Provider, [place] (Jess B)  I discussed the limitations of evaluation and management by telemedicine and the availability of in person appointments. The patient expressed understanding and agreed to proceed.  Subjective:   HPI:   L thumb pain- pt reports it is hard to 'twist things or lift things'.  Has to use both hands b/c 'I don't trust it'.  sxs started 'a couple of months ago'.  Pain at [facility] joint.  Pt does a lot of gardening, watches granddaughter multiple days a week and she requires being picked up/held.  No radiation of pain into forearm.  No associated numbness.  Has tried OTC Aleve w/ some relief.  ROS:   See pertinent positives and negatives per HPI.  Patient Active Problem List   Diagnosis Date Noted  . Hyperlipidemia 02/22/2017  . Overweight (BMI 25.0-29.9) 02/22/2017    Social History   Tobacco Use  . Smoking status: Never Smoker  . Smokeless tobacco: Never Used  Substance Use Topics  . Alcohol use: Yes    Alcohol/week: 0.0 standard drinks    Comment: rare    Current Outpatient Medications:  .  Ascorbic Acid (VITAMIN C PO), Take by mouth., Disp: , Rfl:  .  Calcium Carbonate-Vitamin D (CALCIUM + D PO), Take by mouth., Disp: , Rfl:  .  fluticasone (FLONASE) 50 MCG/ACT nasal spray, One or 2 sprays into each nostril daily for allergy control, Disp: , Rfl:  .  Multiple Vitamins-Minerals (CENTRUM SILVER 50+WOMEN PO), Take by mouth., Disp: , Rfl:  .  Multiple Vitamins-Minerals (ZINC PO), Take by mouth., Disp: , Rfl:  .  NONFORMULARY OR COMPOUNDED ITEM, Estradiol vag cream 0.02% 123456 prefilled applicators Insert applicatorful (1 gram)  twice  weekly., Disp: 90 each, Rfl: 4  Allergies  Allergen Reactions  . Amoxicillin Diarrhea  . Cephalexin Rash    Objective:   There were no vitals taken for this visit.  AAOx3, NAD NCAT, EOMI No obvious CN deficits Coloring WNL Pt is able to speak clearly, coherently without shortness of breath or increased work of breathing.  Thought process is linear.  Mood is appropriate.   Assessment and Plan:   L [facility] joint pain- new.  Pt does a lot of gardening and is watching her granddaughter multiple times/week.  Suspect it is this repetitive lifting motion that caused issues for her.  Start Prednisone taper, ice, and pt to get OTC wrist splint to partially immobilize thumb.  If no improvement will need referral to hand specialist.  Pt expressed understanding and is in agreement w/ plan.    Annye Asa, MD 08/27/2019

## 2019-08-27 NOTE — Progress Notes (Signed)
I have discussed the procedure for the virtual visit with the patient who has given consent to proceed with assessment and treatment.   Pt unable to obtain vitals.   Gisela Lea L Nusaybah Ivie, CMA     

## 2019-09-07 ENCOUNTER — Encounter: Payer: Self-pay | Admitting: Family Medicine

## 2019-09-09 ENCOUNTER — Encounter: Payer: Self-pay | Admitting: Physician Assistant

## 2019-09-09 ENCOUNTER — Other Ambulatory Visit: Payer: Self-pay

## 2019-09-09 ENCOUNTER — Telehealth (INDEPENDENT_AMBULATORY_CARE_PROVIDER_SITE_OTHER): Payer: Medicare Other | Admitting: Physician Assistant

## 2019-09-09 VITALS — BP 128/64 | HR 67

## 2019-09-09 DIAGNOSIS — S0990XA Unspecified injury of head, initial encounter: Secondary | ICD-10-CM | POA: Diagnosis not present

## 2019-09-09 NOTE — Patient Instructions (Signed)
Instructions sent to MyChart

## 2019-09-09 NOTE — Progress Notes (Signed)
I have discussed the procedure for the virtual visit with the patient who has given consent to proceed with assessment and treatment.   Anise Harbin S Curtiss Mahmood, CMA     

## 2019-09-09 NOTE — Progress Notes (Signed)
Virtual Visit via Video   I connected with patient on 09/09/19 at  3:00 PM EDT by a video enabled telemedicine application and verified that I am speaking with the correct person using two identifiers.  Location patient: Home Location provider: [facility], Office Persons participating in the virtual visit: Patient, Provider, [place] (Patina Moore)  I discussed the limitations of evaluation and management by telemedicine and the availability of in person appointments. The patient expressed understanding and agreed to proceed.  Subjective:   HPI:   Patient presents via [place] today to discuss head injury.  Patient endorses on 08/28/2019 she was sitting in a lawn chair and fell out of it while trying to lean to reach something.  Notes her head hit a brick.  Denies any loss of consciousness.  Did note pain on impact with soft tissue swelling occurring right after.  States she went home and applied ice to the area and the swelling mostly resolved.  States she had a little bit of soreness but otherwise felt fine the rest of the day.  The next day she was driving and noticed some bruising around her left eye.  States this is mostly healed now.  Denies noting any headache, neck pain, nausea, vomiting or vision changes.  Denied any decreased range of motion of her eyes.  States she felt pretty fine until this past Saturday, 09/06/2019.  States she bent down to get something and on raising up noted some vertigo and nausea.  States she took some Dramamine and rested.  Had some residual vertigo with positional change only the rest of the day.  No recurrence since.  Notes she has been resting for the past few days.  States she feels great today has been going out and about without any issue.  States her family recommended she talk to her provider.  ROS:   See pertinent positives and negatives per HPI.  Patient Active Problem List   Diagnosis Date Noted  . Hyperlipidemia 02/22/2017  . Overweight  (BMI 25.0-29.9) 02/22/2017    Social History   Tobacco Use  . Smoking status: Never Smoker  . Smokeless tobacco: Never Used  Substance Use Topics  . Alcohol use: Yes    Alcohol/week: 0.0 standard drinks    Comment: rare    Current Outpatient Medications:  .  Calcium Carbonate-Vitamin D (CALCIUM + D PO), Take by mouth., Disp: , Rfl:  .  fluticasone (FLONASE) 50 MCG/ACT nasal spray, One or 2 sprays into each nostril daily for allergy control, Disp: , Rfl:  .  Multiple Vitamins-Minerals (CENTRUM SILVER 50+WOMEN PO), Take by mouth., Disp: , Rfl:  .  NONFORMULARY OR COMPOUNDED ITEM, Estradiol vag cream 0.02% 123456 prefilled applicators Insert applicatorful (1 gram)  twice weekly., Disp: 90 each, Rfl: 4  Allergies  Allergen Reactions  . Amoxicillin Diarrhea  . Cephalexin Rash    Objective:   BP 128/64   Pulse 67   SpO2 98%   Patient is well-developed, well-nourished in no acute distress.  Resting comfortably at home.  Head is normocephalic, atraumatic.  No labored breathing.  Speech is clear and coherent with logical content.  Patient is alert and oriented at baseline.  Normal facial tone. Slight bruising of L temple noted EOMI.   Assessment and Plan:   1. Injury of head, initial encounter 12 days ago.  No loss of consciousness.  Had 1 day of positional vertigo greater than 1 week later.  Discussed with her this could potentially be completely  unrelated, especially without her having headaches, vision changes, photophobia and fatigue in the meantime that would suggest a postconcussive syndrome.  Completely asymptomatic at present.  Mild residual bruising of left temple noted.  Discussed this should continue resolving.  Her examination including extraocular movements are completely within normal limits.  Supportive measures reviewed with patient.  Reassurance given.  Follow-up as needed if there is any new or recurring symptoms.    Leeanne Rio, PA-C 09/09/2019

## 2019-09-11 ENCOUNTER — Emergency Department (HOSPITAL_COMMUNITY)
Admission: EM | Admit: 2019-09-11 | Discharge: 2019-09-11 | Disposition: A | Discharge status: Home / Self Care | Payer: Medicare Other | Attending: Emergency Medicine | Admitting: Emergency Medicine

## 2019-09-11 ENCOUNTER — Emergency Department (HOSPITAL_COMMUNITY): Payer: Medicare Other

## 2019-09-11 ENCOUNTER — Encounter (HOSPITAL_COMMUNITY): Payer: Self-pay | Admitting: Emergency Medicine

## 2019-09-11 DIAGNOSIS — Y999 Unspecified external cause status: Secondary | ICD-10-CM | POA: Insufficient documentation

## 2019-09-11 DIAGNOSIS — S199XXA Unspecified injury of neck, initial encounter: Secondary | ICD-10-CM | POA: Diagnosis not present

## 2019-09-11 DIAGNOSIS — R42 Dizziness and giddiness: Secondary | ICD-10-CM | POA: Insufficient documentation

## 2019-09-11 DIAGNOSIS — M542 Cervicalgia: Secondary | ICD-10-CM | POA: Diagnosis not present

## 2019-09-11 DIAGNOSIS — Z79899 Other long term (current) drug therapy: Secondary | ICD-10-CM | POA: Diagnosis not present

## 2019-09-11 DIAGNOSIS — Y92017 Garden or yard in single-family (private) house as the place of occurrence of the external cause: Secondary | ICD-10-CM | POA: Diagnosis not present

## 2019-09-11 DIAGNOSIS — R110 Nausea: Secondary | ICD-10-CM | POA: Insufficient documentation

## 2019-09-11 DIAGNOSIS — W07XXXA Fall from chair, initial encounter: Secondary | ICD-10-CM | POA: Insufficient documentation

## 2019-09-11 DIAGNOSIS — S0990XA Unspecified injury of head, initial encounter: Secondary | ICD-10-CM | POA: Diagnosis not present

## 2019-09-11 DIAGNOSIS — Y939 Activity, unspecified: Secondary | ICD-10-CM | POA: Insufficient documentation

## 2019-09-11 DIAGNOSIS — R519 Headache, unspecified: Secondary | ICD-10-CM | POA: Insufficient documentation

## 2019-09-11 LAB — CBC
HCT: 41.8 % (ref 36.0–46.0)
Hemoglobin: 13.3 g/dL (ref 12.0–15.0)
MCH: 29 pg (ref 26.0–34.0)
MCHC: 31.8 g/dL (ref 30.0–36.0)
MCV: 91.1 fL (ref 80.0–100.0)
Platelets: 302 10*3/uL (ref 150–400)
RBC: 4.59 MIL/uL (ref 3.87–5.11)
RDW: 13.2 % (ref 11.5–15.5)
WBC: 5.5 10*3/uL (ref 4.0–10.5)
nRBC: 0 % (ref 0.0–0.2)

## 2019-09-11 LAB — BASIC METABOLIC PANEL
Anion gap: 10 (ref 5–15)
BUN: 12 mg/dL (ref 8–23)
CO2: 24 mmol/L (ref 22–32)
Calcium: 9.5 mg/dL (ref 8.9–10.3)
Chloride: 107 mmol/L (ref 98–111)
Creatinine, Ser: 0.57 mg/dL (ref 0.44–1.00)
GFR calc Af Amer: >60 mL/min (ref >60)
GFR calc non Af Amer: >60 mL/min (ref >60)
Glucose, Bld: 91 mg/dL (ref 70–99)
Potassium: 3.7 mmol/L (ref 3.5–5.1)
Sodium: 141 mmol/L (ref 135–145)

## 2019-09-11 MED ORDER — MECLIZINE HCL 12.5 MG PO TABS: 12.5000 mg | ORAL_TABLET | Freq: Three times a day (TID) | ORAL | 0 refills | Status: DC | PRN

## 2019-09-11 MED ORDER — IOHEXOL 350 MG/ML SOLN
75.0000 mL | Freq: Once | INTRAVENOUS | Status: AC | PRN
  Administered 2019-09-11: 75 mL via INTRAVENOUS

## 2019-09-11 NOTE — ED Notes (Signed)
Patient verbalizes understanding of discharge instructions. Opportunity for questioning and answers were provided. Armband removed by staff, pt discharged from ED via wheelchair.  

## 2019-09-11 NOTE — ED Provider Notes (Signed)
[facility] EMERGENCY DEPARTMENT Provider Note   CSN: [id number] Arrival date & time: 09/11/19  1801     History Chief Complaint  Patient presents with   Head Injury    Katherine Blake is a 74 y.o. female.  HPI Patient presents with vertigo.  On March 18 she been sitting on lunch and fell over sideways hitting the left temporal/forehead area.  States she had some swelling in her little black eye but doing fine.  Since then she had some episodes of vertigo.  States she had a couple episode of she feels nauseous and feels the room spinning around.  States they have resolved on their own.  States she saw her PCP and had video visit and they thought if it recurred she would need to come to the ER.  States she been doing well but had another episode today.  States last around an hour hour and a half this morning.  States she feels if the room was spinning around.  At times get headaches.  Has had some mild neck pain more at the time of the fall but somewhat since.  No other numbness or weakness.  No history of stroke.    Past Medical History:  Diagnosis Date   Allergy    History of chicken pox     Patient Active Problem List   Diagnosis Date Noted   Hyperlipidemia 02/22/2017   Overweight (BMI 25.0-29.9) 02/22/2017    Past Surgical History:  Procedure Laterality Date   ABDOMINAL HYSTERECTOMY  1987   TAH,BSO Endometriosis   CESAREAN SECTION     X 2   OOPHORECTOMY     BSO   PELVIC LAPAROSCOPY  1980     OB History    Gravida  2   Para  2   Term  2   Preterm      AB  0   Living  2     SAB      TAB      Ectopic      Multiple      Live Births              Family History  Problem Relation Age of Onset   Heart failure Father    Cancer Maternal Grandmother        Leukemia   Dementia Mother    CAD Mother    Thrombocytopenia Mother    Other Mother        JAK2 positive    Social History   Tobacco Use   Smoking  status: Never Smoker   Smokeless tobacco: Never Used  Substance Use Topics   Alcohol use: Yes    Alcohol/week: 0.0 standard drinks    Comment: rare   Drug use: No    Home Medications Prior to Admission medications   Medication Sig Start Date End Date Taking? Authorizing Provider  Calcium Carbonate-Vitamin D (CALCIUM + D PO) Take 1 tablet by mouth daily.    Yes [provider]  fluticasone (FLONASE) 50 MCG/ACT nasal spray Place 1 spray into both nostrils every evening.  08/11/16  Yes [provider]  Multiple Vitamins-Minerals (CENTRUM SILVER 50+WOMEN PO) Take 1 tablet by mouth daily.    Yes [provider]  NONFORMULARY OR COMPOUNDED ITEM Estradiol vag cream 0.02% 123456 prefilled applicators Insert applicatorful (1 gram)  twice weekly. 01/20/19  Yes Fontaine, Belinda Block, MD  meclizine (ANTIVERT) 12.5 MG tablet Take 1 tablet (12.5 mg total) by mouth  3 (three) times daily as needed for dizziness. 09/11/19   Davonna Belling, MD    Allergies    Amoxicillin and Cephalexin  Review of Systems   Review of Systems  Constitutional: Negative for appetite change.  Cardiovascular: Negative for chest pain.  Gastrointestinal: Positive for nausea. Negative for abdominal distention.  Genitourinary: Negative for flank pain.  Musculoskeletal: Positive for neck pain.  Skin: Negative for wound.  Neurological: Positive for dizziness and headaches. Negative for weakness.  Psychiatric/Behavioral: Negative for confusion.    Physical Exam Updated Vital Signs BP 123/61    Pulse 71    Temp 98.6 F (37 C) (Oral)    Resp 16    Ht 5\' 3"  (1.6 m)    Wt 64.9 kg    SpO2 99%    BMI 25.33 kg/m   Physical Exam Vitals and nursing note reviewed.  HENT:     Head: Atraumatic.  Eyes:     Pupils: Pupils are equal, round, and reactive to light.     Comments: No nystagmus.  Neck:     Comments: Mild tenderness on left posterior neck. Cardiovascular:     Rate and Rhythm: Regular rhythm.    Abdominal:     Tenderness: There is no abdominal tenderness.  Musculoskeletal:        General: Tenderness present. No deformity.     Cervical back: Neck supple.  Skin:    General: Skin is warm.     Capillary Refill: Capillary refill takes less than 2 seconds.  Neurological:     Mental Status: She is alert and oriented to person, place, and time. Mental status is at baseline.     Comments: Finger-nose intact bilaterally.  No nystagmus.     ED Results / Procedures / Treatments   Labs (all labs ordered are listed, but only abnormal results are displayed) Labs Reviewed  CBC  BASIC METABOLIC PANEL    EKG None  Radiology CT Angio Head W or Wo Contrast  Result Date: 09/11/2019 CLINICAL DATA:  Central vertigo. Fall. EXAM: CT ANGIOGRAPHY HEAD AND NECK TECHNIQUE: Multidetector CT imaging of the head and neck was performed using the standard protocol during bolus administration of intravenous contrast. Multiplanar CT image reconstructions and MIPs were obtained to evaluate the vascular anatomy. Carotid stenosis measurements (when applicable) are obtained utilizing NASCET criteria, using the distal internal carotid diameter as the denominator. CONTRAST:  34mL OMNIPAQUE IOHEXOL 350 MG/ML SOLN COMPARISON:  None. FINDINGS: CT HEAD FINDINGS Brain: There is no mass, hemorrhage or extra-axial collection. The size and configuration of the ventricles and extra-axial CSF spaces are normal. There is no acute or chronic infarction. The brain parenchyma is normal. Skull: The visualized skull base, calvarium and extracranial soft tissues are normal. Sinuses/Orbits: No fluid levels or advanced mucosal thickening of the visualized paranasal sinuses. No mastoid or middle ear effusion. The orbits are normal. CTA NECK FINDINGS SKELETON: There is no bony spinal canal stenosis. No lytic or blastic lesion. OTHER NECK: Normal pharynx, larynx and major salivary glands. No cervical lymphadenopathy. Unremarkable thyroid  gland. UPPER CHEST: No pneumothorax or pleural effusion. No nodules or masses. AORTIC ARCH: There is no calcific atherosclerosis of the aortic arch. There is no aneurysm, dissection or hemodynamically significant stenosis of the visualized portion of the aorta. Normal variant aortic arch branching pattern with the left vertebral artery arising independently from the aortic arch. The visualized proximal subclavian arteries are widely patent. RIGHT CAROTID SYSTEM: Normal without aneurysm, dissection or stenosis. LEFT CAROTID SYSTEM:  Normal without aneurysm, dissection or stenosis. VERTEBRAL ARTERIES: Left dominant configuration. Both origins are clearly patent. There is no dissection, occlusion or flow-limiting stenosis to the skull base (V1-V3 segments). CTA HEAD FINDINGS POSTERIOR CIRCULATION: --Vertebral arteries: Normal V4 segments. --Posterior inferior cerebellar arteries (PICA): Patent origins from the vertebral arteries. --Anterior inferior cerebellar arteries (AICA): Patent origins from the basilar artery. --Basilar artery: Normal. --Superior cerebellar arteries: Normal. --Posterior cerebral arteries: Normal. Both originate from the basilar artery. Posterior communicating arteries (p-comm) are diminutive or absent. ANTERIOR CIRCULATION: --Intracranial internal carotid arteries: Normal. --Anterior cerebral arteries (ACA): Normal. Both A1 segments are present. Patent anterior communicating artery (a-comm). --Middle cerebral arteries (MCA): Normal. VENOUS SINUSES: As permitted by contrast timing, patent. ANATOMIC VARIANTS: None Review of the MIP images confirms the above findings. IMPRESSION: Normal CTA of the head and neck. Electronically Signed   By: Ulyses Jarred M.D.   On: 09/11/2019 20:44   CT Angio Neck W and/or Wo Contrast  Result Date: 09/11/2019 CLINICAL DATA:  Central vertigo. Fall. EXAM: CT ANGIOGRAPHY HEAD AND NECK TECHNIQUE: Multidetector CT imaging of the head and neck was performed using the  standard protocol during bolus administration of intravenous contrast. Multiplanar CT image reconstructions and MIPs were obtained to evaluate the vascular anatomy. Carotid stenosis measurements (when applicable) are obtained utilizing NASCET criteria, using the distal internal carotid diameter as the denominator. CONTRAST:  43mL OMNIPAQUE IOHEXOL 350 MG/ML SOLN COMPARISON:  None. FINDINGS: CT HEAD FINDINGS Brain: There is no mass, hemorrhage or extra-axial collection. The size and configuration of the ventricles and extra-axial CSF spaces are normal. There is no acute or chronic infarction. The brain parenchyma is normal. Skull: The visualized skull base, calvarium and extracranial soft tissues are normal. Sinuses/Orbits: No fluid levels or advanced mucosal thickening of the visualized paranasal sinuses. No mastoid or middle ear effusion. The orbits are normal. CTA NECK FINDINGS SKELETON: There is no bony spinal canal stenosis. No lytic or blastic lesion. OTHER NECK: Normal pharynx, larynx and major salivary glands. No cervical lymphadenopathy. Unremarkable thyroid gland. UPPER CHEST: No pneumothorax or pleural effusion. No nodules or masses. AORTIC ARCH: There is no calcific atherosclerosis of the aortic arch. There is no aneurysm, dissection or hemodynamically significant stenosis of the visualized portion of the aorta. Normal variant aortic arch branching pattern with the left vertebral artery arising independently from the aortic arch. The visualized proximal subclavian arteries are widely patent. RIGHT CAROTID SYSTEM: Normal without aneurysm, dissection or stenosis. LEFT CAROTID SYSTEM: Normal without aneurysm, dissection or stenosis. VERTEBRAL ARTERIES: Left dominant configuration. Both origins are clearly patent. There is no dissection, occlusion or flow-limiting stenosis to the skull base (V1-V3 segments). CTA HEAD FINDINGS POSTERIOR CIRCULATION: --Vertebral arteries: Normal V4 segments. --Posterior  inferior cerebellar arteries (PICA): Patent origins from the vertebral arteries. --Anterior inferior cerebellar arteries (AICA): Patent origins from the basilar artery. --Basilar artery: Normal. --Superior cerebellar arteries: Normal. --Posterior cerebral arteries: Normal. Both originate from the basilar artery. Posterior communicating arteries (p-comm) are diminutive or absent. ANTERIOR CIRCULATION: --Intracranial internal carotid arteries: Normal. --Anterior cerebral arteries (ACA): Normal. Both A1 segments are present. Patent anterior communicating artery (a-comm). --Middle cerebral arteries (MCA): Normal. VENOUS SINUSES: As permitted by contrast timing, patent. ANATOMIC VARIANTS: None Review of the MIP images confirms the above findings. IMPRESSION: Normal CTA of the head and neck. Electronically Signed   By: Ulyses Jarred M.D.   On: 09/11/2019 20:44    Procedures Procedures (including critical care time)  Medications Ordered in ED Medications  iohexol (OMNIPAQUE)  350 MG/ML injection 75 mL (75 mLs Intravenous Contrast Given 09/11/19 2013)    ED Course  I have reviewed the triage vital signs and the nursing notes.  Pertinent labs & imaging results that were available during my care of the patient were reviewed by me and considered in my medical decision making (see chart for details).    MDM Rules/Calculators/A&P                      Patient with episodes of vertigo.  Had fall a few weeks ago.  CTA done and reassuring.  Central cause felt less likely.  Will treat symptomatically and have follow-up with ENT. Final Clinical Impression(s) / ED Diagnoses Final diagnoses:  Vertigo    Rx / DC Orders ED Discharge Orders         Ordered    meclizine (ANTIVERT) 12.5 MG tablet  3 times daily PRN     09/11/19 2106           Davonna Belling, MD 09/12/19 0006

## 2019-09-11 NOTE — ED Triage Notes (Signed)
Pt states on 3/18 she was sitting outside in lawn chair and she leaned over and fell out of chair and hit head on bricks. Pt states now she has been having "vertigo" worse today- pt states she feels nauseous and at times feels like the room is spinning.

## 2019-09-16 ENCOUNTER — Ambulatory Visit: Payer: Medicare Other | Admitting: Physician Assistant

## 2019-09-16 DIAGNOSIS — R42 Dizziness and giddiness: Secondary | ICD-10-CM | POA: Diagnosis not present

## 2019-09-16 DIAGNOSIS — H838X3 Other specified diseases of inner ear, bilateral: Secondary | ICD-10-CM | POA: Diagnosis not present

## 2019-09-16 DIAGNOSIS — H903 Sensorineural hearing loss, bilateral: Secondary | ICD-10-CM | POA: Diagnosis not present

## 2019-10-07 ENCOUNTER — Other Ambulatory Visit: Payer: Self-pay

## 2019-10-07 ENCOUNTER — Telehealth: Payer: Self-pay | Admitting: Family Medicine

## 2019-10-07 DIAGNOSIS — M19049 Primary osteoarthritis, unspecified hand: Secondary | ICD-10-CM

## 2019-10-07 NOTE — Telephone Encounter (Signed)
Referral has been ordered

## 2019-10-07 NOTE — Telephone Encounter (Signed)
Pt called in stating that the hand pain is not any better she wanted to go ahead with the referral as discussed at her VV with Tabori in March.   She mentioned a Dr. Delilah Shan but is open to whomever Dr. Birdie Riddle thinks would be a good fit.    Please advise

## 2019-10-14 ENCOUNTER — Other Ambulatory Visit: Payer: Self-pay

## 2019-10-14 ENCOUNTER — Encounter: Payer: Self-pay | Admitting: Orthopaedic Surgery

## 2019-10-14 ENCOUNTER — Ambulatory Visit (INDEPENDENT_AMBULATORY_CARE_PROVIDER_SITE_OTHER): Payer: Medicare Other | Admitting: Orthopaedic Surgery

## 2019-10-14 ENCOUNTER — Ambulatory Visit (INDEPENDENT_AMBULATORY_CARE_PROVIDER_SITE_OTHER): Payer: Medicare Other

## 2019-10-14 VITALS — Ht 62.0 in | Wt 143.0 lb

## 2019-10-14 DIAGNOSIS — M65831 Other synovitis and tenosynovitis, right forearm: Secondary | ICD-10-CM | POA: Insufficient documentation

## 2019-10-14 DIAGNOSIS — M1812 Unilateral primary osteoarthritis of first carpometacarpal joint, left hand: Secondary | ICD-10-CM | POA: Diagnosis not present

## 2019-10-14 DIAGNOSIS — M25531 Pain in right wrist: Secondary | ICD-10-CM

## 2019-10-14 DIAGNOSIS — M65931 Unspecified synovitis and tenosynovitis, right forearm: Secondary | ICD-10-CM | POA: Insufficient documentation

## 2019-10-14 DIAGNOSIS — M79642 Pain in left hand: Secondary | ICD-10-CM | POA: Diagnosis not present

## 2019-10-14 MED ORDER — MELOXICAM 7.5 MG PO TABS: 7.5000 mg | ORAL_TABLET | Freq: Two times a day (BID) | ORAL | 2 refills | Status: DC | PRN

## 2019-10-14 NOTE — Progress Notes (Signed)
Office Visit Note   Patient: Katherine Blake           Date of Birth: 1945/12/01           MRN: [id number] Visit Date: 10/14/2019              Requested by: Midge Minium, MD [address] [place],  [place] [address] PCP: Midge Minium, MD   Assessment & Plan: Visit Diagnoses:  1. Extensor tenosynovitis of right wrist   2. Primary osteoarthritis of first carpometacarpal joint of left hand     Plan: Impression is left thumb CMC arthrosis and right wrist ECU tenosynovitis.  We discussed multiple treatment options including oral NSAIDs, topical NSAIDs, immobilization, cortisone injections.  Patient would like to start with mobilization and oral and topical NSAIDs today.  Questions encouraged and answered.  Follow-up as needed.  Follow-Up Instructions: Return if symptoms worsen or fail to improve.   Orders:  Orders Placed This Encounter  Procedures  . XR Hand Complete Left  . XR Wrist Complete Right   Meds ordered this encounter  Medications  . meloxicam (MOBIC) 7.5 MG tablet    Sig: Take 1 tablet (7.5 mg total) by mouth 2 (two) times daily as needed for pain.    Dispense:  30 tablet    Refill:  2      Procedures: No procedures performed   Clinical Data: No additional findings.   Subjective: Chief Complaint  Patient presents with  . Left Hand - Pain  . Right Wrist - Pain    Katherine Blake is a very pleasant 74 year old female comes in for evaluation of left thumb pain and right wrist pain.  For the left thumb she hurts at the base of the [facility] and has deep pain in the thenar compartment.  This is worse with grasping and pinching.  Denies any radiation of pain or numbness and tingling or any triggering.  For the right wrist she has pain along the ulnar aspect of the wrist overlying the ECU tendon.  Denies any popping or subluxation or injuries or numbness and tingling.  She takes Aleve as needed.  She is fairly active with her hands.  She is retired.   Review of  Systems  Constitutional: Negative.   HENT: Negative.   Eyes: Negative.   Respiratory: Negative.   Cardiovascular: Negative.   Endocrine: Negative.   Musculoskeletal: Negative.   Neurological: Negative.   Hematological: Negative.   Psychiatric/Behavioral: Negative.   All other systems reviewed and are negative.    Objective: Vital Signs: Ht 5\' 2"  (1.575 m)   Wt 143 lb (64.9 kg)   BMI 26.16 kg/m   Physical Exam Vitals and nursing note reviewed.  Constitutional:      Appearance: She is well-developed.  HENT:     Head: Normocephalic and atraumatic.  Pulmonary:     Effort: Pulmonary effort is normal.  Abdominal:     Palpations: Abdomen is soft.  Musculoskeletal:     Cervical back: Neck supple.  Skin:    General: Skin is warm.     Capillary Refill: Capillary refill takes less than 2 seconds.  Neurological:     Mental Status: She is alert and oriented to person, place, and time.  Psychiatric:        Behavior: Behavior normal.        Thought Content: Thought content normal.        Judgment: Judgment normal.     Ortho  Exam Left hand exam shows positive grind test.  No triggering.  Negative Finkelstein.  Right wrist is moderate tenderness along the ECU tendon without subluxation.  Slight discomfort with resisted wrist extension and ulnar deviation. Specialty Comments:  No specialty comments available.  Imaging: XR Wrist Complete Right  Result Date: 10/14/2019 No acute or structural abnormalities  XR Hand Complete Left  Result Date: 10/14/2019 Bone on bone thumb CMC OA    PMFS History: Patient Active Problem List   Diagnosis Date Noted  . Extensor tenosynovitis of right wrist 10/14/2019  . Primary osteoarthritis of first carpometacarpal joint of left hand 10/14/2019  . Hyperlipidemia 02/22/2017  . Overweight (BMI 25.0-29.9) 02/22/2017   Past Medical History:  Diagnosis Date  . Allergy   . History of chicken pox     Family History  Problem Relation Age  of Onset  . Heart failure Father   . Cancer Maternal Grandmother        Leukemia  . Dementia Mother   . CAD Mother   . Thrombocytopenia Mother   . Other Mother        JAK2 positive    Past Surgical History:  Procedure Laterality Date  . ABDOMINAL HYSTERECTOMY  1987   TAH,BSO Endometriosis  . CESAREAN SECTION     X 2  . OOPHORECTOMY     BSO  . PELVIC LAPAROSCOPY  1980   Social History   Occupational History  . Not on file  Tobacco Use  . Smoking status: Never Smoker  . Smokeless tobacco: Never Used  Substance and Sexual Activity  . Alcohol use: Yes    Alcohol/week: 0.0 standard drinks    Comment: rare  . Drug use: No  . Sexual activity: Yes    Birth control/protection: Surgical    Comment: HYST-1st intercourse 74 yo-Fewer than 5 partners

## 2019-11-17 DIAGNOSIS — H6123 Impacted cerumen, bilateral: Secondary | ICD-10-CM | POA: Diagnosis not present

## 2019-11-17 DIAGNOSIS — R42 Dizziness and giddiness: Secondary | ICD-10-CM | POA: Diagnosis not present

## 2019-11-19 ENCOUNTER — Ambulatory Visit: Payer: Medicare Other

## 2019-11-25 ENCOUNTER — Telehealth: Payer: Self-pay | Admitting: Family Medicine

## 2019-11-25 ENCOUNTER — Encounter: Payer: Medicare Other | Admitting: Obstetrics and Gynecology

## 2019-11-25 NOTE — Telephone Encounter (Signed)
Called number to r/a cancellled appt no voicemail-khc

## 2019-12-08 ENCOUNTER — Other Ambulatory Visit: Payer: Self-pay

## 2019-12-09 ENCOUNTER — Encounter: Payer: Self-pay | Admitting: Obstetrics and Gynecology

## 2019-12-09 ENCOUNTER — Ambulatory Visit (INDEPENDENT_AMBULATORY_CARE_PROVIDER_SITE_OTHER): Payer: Medicare Other | Admitting: Obstetrics and Gynecology

## 2019-12-09 VITALS — BP 118/76 | Ht 61.5 in | Wt 146.0 lb

## 2019-12-09 DIAGNOSIS — Z01419 Encounter for gynecological examination (general) (routine) without abnormal findings: Secondary | ICD-10-CM

## 2019-12-09 DIAGNOSIS — M8588 Other specified disorders of bone density and structure, other site: Secondary | ICD-10-CM

## 2019-12-09 DIAGNOSIS — N952 Postmenopausal atrophic vaginitis: Secondary | ICD-10-CM

## 2019-12-09 DIAGNOSIS — Z13820 Encounter for screening for osteoporosis: Secondary | ICD-10-CM

## 2019-12-09 MED ORDER — NONFORMULARY OR COMPOUNDED ITEM: 4 refills | Status: DC

## 2019-12-09 NOTE — Progress Notes (Signed)
   Katherine Blake 1945-10-14 [id number]  SUBJECTIVE:  74 y.o. [id number] female here for an annual routine gynecologic exam and Pap smear. She has no gynecologic concerns.  Current Outpatient Medications  Medication Sig Dispense Refill  . Calcium Carbonate-Vitamin D (CALCIUM + D PO) Take 1 tablet by mouth daily.     . fluticasone (FLONASE) 50 MCG/ACT nasal spray Place 1 spray into both nostrils every evening.     . Multiple Vitamins-Minerals (CENTRUM SILVER 50+WOMEN PO) Take 1 tablet by mouth daily.     . NONFORMULARY OR COMPOUNDED ITEM Estradiol vag cream 0.02% #38 prefilled applicators Insert applicatorful (1 gram)  twice weekly. 90 each 4   No current facility-administered medications for this visit.   Allergies: Amoxicillin and Cephalexin  No LMP recorded. Patient has had a hysterectomy.  Past medical history,surgical history, problem list, medications, allergies, family history and social history were all reviewed and documented as reviewed in the EPIC chart.  ROS:  Feeling well. No dyspnea or chest pain on exertion.  No abdominal pain, change in bowel habits, black or bloody stools.  No urinary tract symptoms. GYN ROS: no abnormal bleeding, pelvic pain or discharge, no breast pain or new or enlarging lumps on self exam. No neurological complaints.   OBJECTIVE:  BP 118/76   Ht 5' 1.5" (1.562 m)   Wt 146 lb (66.2 kg)   BMI 27.14 kg/m  The patient appears well, alert, oriented x 3, in no distress. ENT normal.  Neck supple. No cervical or supraclavicular adenopathy or thyromegaly.  Lungs are clear, good air entry, no wheezes, rhonchi or rales. S1 and S2 normal, no murmurs, regular rate and rhythm.  Abdomen soft without tenderness, guarding, mass or organomegaly.  Neurological is normal, no focal findings.  BREAST EXAM: breasts appear normal, no suspicious masses, no skin or nipple changes or axillary nodes  PELVIC EXAM: VULVA: normal appearing vulva with no masses, tenderness or  lesions, VAGINA: normal appearing vagina with normal color and discharge, no lesions, CERVIX: surgically absent, UTERUS: surgically absent, vaginal cuff normal, ADNEXA: no masses, non tender  Chaperone: Caryn Bee present during the examination  ASSESSMENT:  74 y.o. [id number] here for annual gynecologic exam  PLAN:   1. Postmenopausal. Prior TAH/BSO for endometriosis in 1987.  No vaginal bleeding, no significant hot flashes or night sweats.  She is using vaginal estradiol cream twice weekly she obtains through [employer].  Doing well with this with improvement in vaginal dryness symptoms, and she would like to continue.  We did review the risks of systemic absorption although low risk with using the cream as prescribed.  Mentioned that she could consider weaning to once weekly use to see how she does with that.  Refill x1 year provided. 2. Pap smear 11/2018.  No significant history of abnormal Pap smears.  Next Pap smear due 2023 if screening is desired will review at that time. 3. Mammogram 11/2019.  Normal breast exam today.  Continue with annual mammograms. 4. Colonoscopy 2012.  Recommended that she follow up at the recommended interval.  5. DEXA 2016 was normal.  Next DEXA recommended this year so she plans to schedule this.  Test is ordered. 6. Health maintenance.  No labs today as she normally has these completed elsewhere.  Return annually or sooner, prn.  Joseph Pierini MD 12/09/19

## 2019-12-30 ENCOUNTER — Other Ambulatory Visit: Payer: Self-pay | Admitting: Obstetrics and Gynecology

## 2019-12-30 ENCOUNTER — Ambulatory Visit (INDEPENDENT_AMBULATORY_CARE_PROVIDER_SITE_OTHER): Payer: Medicare Other

## 2019-12-30 ENCOUNTER — Other Ambulatory Visit: Payer: Self-pay

## 2019-12-30 DIAGNOSIS — Z780 Asymptomatic menopausal state: Secondary | ICD-10-CM

## 2019-12-30 DIAGNOSIS — Z13820 Encounter for screening for osteoporosis: Secondary | ICD-10-CM

## 2019-12-30 DIAGNOSIS — Z01419 Encounter for gynecological examination (general) (routine) without abnormal findings: Secondary | ICD-10-CM

## 2019-12-30 DIAGNOSIS — M8588 Other specified disorders of bone density and structure, other site: Secondary | ICD-10-CM

## 2020-01-05 NOTE — Progress Notes (Signed)
Subjective:   Katherine Blake is a 74 y.o. female who presents for Medicare Annual (Subsequent) preventive examination.  I connected with Nil today by telephone and verified that I am speaking with the correct person using two identifiers. Location patient: home Location provider: work Persons participating in the virtual visit: patient, Marine scientist.    I discussed the limitations, risks, security and privacy concerns of performing an evaluation and management service by telephone and the availability of in person appointments. I also discussed with the patient that there may be a patient responsible charge related to this service. The patient expressed understanding and verbally consented to this telephonic visit.    Interactive audio and video telecommunications were attempted between this provider and patient, however failed, due to patient having technical difficulties OR patient did not have access to video capability.  We continued and completed visit with audio only.  Some vital signs may be absent or patient reported.   Time Spent with patient on telephone encounter: 20 minutes  Review of Systems     Cardiac Risk Factors include: advanced age (>21men, >51 women);dyslipidemia     Objective:    Today's Vitals   01/06/20 0944  Weight: 143 lb (64.9 kg)  Height: 5\' 2"  (1.575 m)   Body mass index is 26.16 kg/m.  Advanced Directives 01/06/2020 09/11/2019 11/13/2018 08/30/2017  Does Patient Have a Medical Advance Directive? Yes No Yes Yes  Type of Paramedic of [place];Living will - [place];Living will [employer];Living will  Copy of [place] in Chart? No - copy requested - Yes - validated most recent copy scanned in chart (See row information) No - copy requested  Would patient like information on creating a medical advance directive? - No - Patient declined - -    Current Medications  (verified) Outpatient Encounter Medications as of 01/06/2020  Medication Sig  . Calcium Carbonate-Vitamin D (CALCIUM + D PO) Take 1 tablet by mouth daily.   . fluticasone (FLONASE) 50 MCG/ACT nasal spray Place 1 spray into both nostrils every evening.   . Multiple Vitamins-Minerals (CENTRUM SILVER 50+WOMEN PO) Take 1 tablet by mouth daily.   . NONFORMULARY OR COMPOUNDED ITEM Estradiol vag cream 0.02% #97 prefilled applicators Insert applicatorful (1 gram)  twice weekly.   No facility-administered encounter medications on file as of 01/06/2020.    Allergies (verified) Amoxicillin and Cephalexin   History: Past Medical History:  Diagnosis Date  . Allergy   . History of chicken pox    Past Surgical History:  Procedure Laterality Date  . ABDOMINAL HYSTERECTOMY  1987   TAH,BSO Endometriosis  . CESAREAN SECTION     X 2  . OOPHORECTOMY     BSO  . PELVIC LAPAROSCOPY  1980   Family History  Problem Relation Age of Onset  . Heart failure Father   . Cancer Maternal Grandmother        Leukemia  . Dementia Mother   . CAD Mother   . Thrombocytopenia Mother   . Other Mother        JAK2 positive   Social History   Socioeconomic History  . Marital status: Married    Spouse name: Not on file  . Number of children: Not on file  . Years of education: Not on file  . Highest education level: Not on file  Occupational History  . Occupation: REtired  Tobacco Use  . Smoking status: Never Smoker  .  Smokeless tobacco: Never Used  Vaping Use  . Vaping Use: Never used  Substance and Sexual Activity  . Alcohol use: Yes    Alcohol/week: 0.0 standard drinks    Comment: rare  . Drug use: No  . Sexual activity: Yes    Birth control/protection: Surgical    Comment: HYST-1st intercourse 74 yo-Fewer than 5 partners  Other Topics Concern  . Not on file  Social History Narrative  . Not on file   Social Determinants of Health   Financial Resource Strain: Low Risk   . Difficulty of  Paying Living Expenses: Not hard at all  Food Insecurity: No Food Insecurity  . Worried About Charity fundraiser in the Last Year: Never true  . Ran Out of Food in the Last Year: Never true  Transportation Needs: No Transportation Needs  . Lack of Transportation (Medical): No  . Lack of Transportation (Non-Medical): No  Physical Activity: Sufficiently Active  . Days of Exercise per Week: 4 days  . Minutes of Exercise per Session: 60 min  Stress: No Stress Concern Present  . Feeling of Stress : Not at all  Social Connections: Socially Integrated  . Frequency of Communication with Friends and Family: More than three times a week  . Frequency of Social Gatherings with Friends and Family: More than three times a week  . Attends Religious Services: More than 4 times per year  . Active Member of Clubs or Organizations: Yes  . Attends Archivist Meetings: More than 4 times per year  . Marital Status: Married    Tobacco Counseling Counseling given: Not Answered   Clinical Intake:  Pre-visit preparation completed: Yes  Pain : No/denies pain     Nutritional Status: BMI 25 -29 Overweight Nutritional Risks: None Diabetes: No  How often do you need to have someone help you when you read instructions, pamphlets, or other written materials from your doctor or pharmacy?: 1 - Never What is the last grade level you completed in school?: College  Diabetic?No  Interpreter Needed?: Yes Interpreter Name: Caroleen Hamman LPN      Activities of Daily Living In your present state of health, do you have any difficulty performing the following activities: 01/06/2020 08/27/2019  Hearing? N N  Vision? N N  Difficulty concentrating or making decisions? N N  Walking or climbing stairs? N N  Dressing or bathing? N N  Doing errands, shopping? N N  Preparing Food and eating ? N -  Using the Toilet? N -  In the past six months, have you accidently leaked urine? N -  Do you have  problems with loss of bowel control? N -  Managing your Medications? N -  Managing your Finances? N -  Housekeeping or managing your Housekeeping? N -  Some recent data might be hidden    Patient Care Team: Midge Minium, MD as PCP - General (Family Medicine) Harold Hedge, Darrick Grinder, MD as Consulting Physician (Allergy and Immunology) Phineas Real Belinda Block, MD (Inactive) as Consulting Physician (Gynecology) Juanita Craver, MD as Consulting Physician (Gastroenterology) [name], MD as Consulting Physician (Dermatology) Pedro Earls, MD as Attending Physician (Family Medicine) Mariea Clonts (Dentistry)  Indicate any recent Medical Services you may have received from other than [facility] providers in the past year (date may be approximate).     Assessment:   This is a routine wellness examination for [place].  Hearing/Vision screen  Hearing Screening   125Hz  250Hz  500Hz  1000Hz  2000Hz  3000Hz  4000Hz   6000Hz  8000Hz   Right ear:           Left ear:           Comments: No issues  Vision Screening Comments: Wears glasses Last eye exam 02/2019 Sees Dr. Delman Cheadle  Dietary issues and exercise activities discussed: Current Exercise Habits: Home exercise routine, Type of exercise: walking, Time (Minutes): 60, Frequency (Times/Week): 4, Weekly Exercise (Minutes/Week): 240, Intensity: Mild  Goals    . Patient Stated     Maintain current health by staying active.     . Weight (lb) < 140 lb (63.5 kg)     Lose weight by decreasing caloric intake and staying active.       Depression Screen PHQ 2/9 Scores 01/06/2020 08/27/2019 05/21/2019 11/13/2018 03/04/2018 08/30/2017 02/22/2017  PHQ - 2 Score 0 0 0 0 0 0 0  PHQ- 9 Score - - 0 - 0 - 0    Fall Risk Fall Risk  01/06/2020 08/27/2019 05/21/2019 11/13/2018 03/04/2018  Falls in the past year? 1 0 0 1 No  Comment - - - tripped over ivy outdorrs -  Number falls in past yr: 0 0 0 0 -  Injury with Fall? 0 0 0 0 -  Follow up Falls prevention discussed Falls  evaluation completed Falls evaluation completed - -    Any stairs in or around the home? Yes  If so, are there any without handrails? No  Home free of loose throw rugs in walkways, pet beds, electrical cords, etc? Yes  Adequate lighting in your home to reduce risk of falls? Yes   ASSISTIVE DEVICES UTILIZED TO PREVENT FALLS:  Life alert? No  Use of a cane, walker or w/c? No  Grab bars in the bathroom? No  Shower chair or bench in shower? No  Elevated toilet seat or a handicapped toilet? No   TIMED UP AND GO:  Was the test performed? No . Phone visit  Cognitive Function: MMSE - Mini Mental State Exam 11/13/2018  Orientation to time 5  Orientation to Place 5  Registration 3  Attention/ Calculation 5  Recall 1  Language- name 2 objects 2  Language- repeat 1  Language- follow 3 step command 3  Language- read & follow direction 1  Write a sentence 1  Copy design 1  Total score 28     6CIT Screen 01/06/2020  What Year? 0 points  What month? 0 points  What time? 0 points  Count back from 20 0 points  Months in reverse 0 points  Repeat phrase 0 points  Total Score 0    Immunizations Immunization History  Administered Date(s) Administered  . Influenza, High Dose Seasonal PF 02/12/2015, 03/02/2019  . Influenza,inj,Quad PF,6+ Mos 03/13/2016, 02/22/2017, 02/01/2018  . PFIZER SARS-COV-2 Vaccination 07/03/2019, 07/22/2019  . Pneumococcal Conjugate-13 12/04/2013  . Pneumococcal Polysaccharide-23 08/30/2017  . Tdap 12/25/2017  . Zoster 10/18/2010    TDAP status: Up to date Flu Vaccine status: Up to date Pneumococcal vaccine status: Up to date Covid-19 vaccine status: Completed vaccines  Qualifies for Shingles Vaccine? Yes   Zostavax completed Yes   Shingrix Completed?: No.    Education has been provided regarding the importance of this vaccine. Patient has been advised to call insurance company to determine out of pocket expense if they have not yet received this vaccine.  Advised may also receive vaccine at local pharmacy or Health Dept. Verbalized acceptance and understanding.  Screening Tests Health Maintenance  Topic Date Due  . Hepatitis C  Screening  05/20/2020 (Originally 1945-07-10)  . INFLUENZA VACCINE  01/11/2020  . MAMMOGRAM  12/08/2021  . Fecal DNA (Cologuard)  08/03/2022  . TETANUS/TDAP  12/26/2027  . DEXA SCAN  Completed  . COVID-19 Vaccine  Completed  . PNA vac Low Risk Adult  Completed    Health Maintenance  There are no preventive care reminders to display for this patient.  Colorectal cancer screening: Completed Cologuard 08/04/2019. Repeat every 3 years Mammogram status: Completed 12/09/2019. Repeat every year Bone Density status: Completed 12/30/2019. Results reflect: Bone density results: NORMAL. Repeat every 2 years.  Lung Cancer Screening: (Low Dose CT Chest recommended if Age 49-80 years, 30 pack-year currently smoking OR have quit w/in 15years.) does not qualify.    Additional Screening:  Hepatitis C Screening: does qualify; Discuss with PCP  Vision Screening: Recommended annual ophthalmology exams for early detection of glaucoma and other disorders of the eye. Is the patient up to date with their annual eye exam?  Yes  Who is the provider or what is the name of the office in which the patient attends annual eye exams? Dr Delman Cheadle   Dental Screening: Recommended annual dental exams for proper oral hygiene  Community Resource Referral / Chronic Care Management: CRR required this visit?  No   CCM required this visit?  No      Plan:     I have personally reviewed and noted the following in the patient's chart:   . Medical and social history . Use of alcohol, tobacco or illicit drugs  . Current medications and supplements . Functional ability and status . Nutritional status . Physical activity . Advanced directives . List of other physicians . Hospitalizations, surgeries, and ER visits in previous 12  months . Vitals . Screenings to include cognitive, depression, and falls . Referrals and appointments  In addition, I have reviewed and discussed with patient certain preventive protocols, quality metrics, and best practice recommendations. A written personalized care plan for preventive services as well as general preventive health recommendations were provided to patient.    Due to this being a telephonic visit, the after visit summary with patients personalized plan was offered to patient via mail or my-chart. Patient would like to access on my-chart.   Marta Antu, LPN   08/31/252  Nurse Health Advisor  Nurse Notes: None

## 2020-01-06 ENCOUNTER — Ambulatory Visit (INDEPENDENT_AMBULATORY_CARE_PROVIDER_SITE_OTHER): Payer: Medicare Other

## 2020-01-06 VITALS — Ht 62.0 in | Wt 143.0 lb

## 2020-01-06 DIAGNOSIS — Z0000 Encounter for general adult medical examination without abnormal findings: Secondary | ICD-10-CM

## 2020-01-06 NOTE — Patient Instructions (Signed)
Katherine Blake , Thank you for taking time to come for your Medicare Wellness Visit. I appreciate your ongoing commitment to your health goals. Please review the following plan we discussed and let me know if I can assist you in the future.   Screening recommendations/referrals: Colonoscopy: Cologuard completed 08/04/2019-Due 08/03/2022 Mammogram: Completed 12/09/2019 Due-12/08/2020 Bone Density: Completed 12/30/2019- Due 12/29/2021 Recommended yearly ophthalmology/optometry visit for glaucoma screening and checkup Recommended yearly dental visit for hygiene and checkup  Vaccinations: Influenza vaccine: Up to Date-Due 02/2020 Pneumococcal vaccine: Completed vaccines Tdap vaccine: Up to date-Due-12/26/2027 Shingles vaccine: Discuss with pharmacy   Covid-19:Completed vaccines  Advanced directives: Please bring a copy to yur next office visit  Conditions/risks identified: See problem list  Next appointment: Follow up in one year for your annual wellness visit    Preventive Care 65 Years and Older, Female Preventive care refers to lifestyle choices and visits with your health care provider that can promote health and wellness. What does preventive care include?  A yearly physical exam. This is also called an annual well check.  Dental exams once or twice a year.  Routine eye exams. Ask your health care provider how often you should have your eyes checked.  Personal lifestyle choices, including:  Daily care of your teeth and gums.  Regular physical activity.  Eating a healthy diet.  Avoiding tobacco and drug use.  Limiting alcohol use.  Practicing safe sex.  Taking low-dose aspirin every day.  Taking vitamin and mineral supplements as recommended by your health care provider. What happens during an annual well check? The services and screenings done by your health care provider during your annual well check will depend on your age, overall health, lifestyle risk factors, and  family history of disease. Counseling  Your health care provider may ask you questions about your:  Alcohol use.  Tobacco use.  Drug use.  Emotional well-being.  Home and relationship well-being.  Sexual activity.  Eating habits.  History of falls.  Memory and ability to understand (cognition).  Work and work Statistician.  Reproductive health. Screening  You may have the following tests or measurements:  Height, weight, and BMI.  Blood pressure.  Lipid and cholesterol levels. These may be checked every 5 years, or more frequently if you are over 62 years old.  Skin check.  Lung cancer screening. You may have this screening every year starting at age 29 if you have a 30-pack-year history of smoking and currently smoke or have quit within the past 15 years.  Fecal occult blood test (FOBT) of the stool. You may have this test every year starting at age 10.  Flexible sigmoidoscopy or colonoscopy. You may have a sigmoidoscopy every 5 years or a colonoscopy every 10 years starting at age 75.  Hepatitis C blood test.  Hepatitis B blood test.  Sexually transmitted disease (STD) testing.  Diabetes screening. This is done by checking your blood sugar (glucose) after you have not eaten for a while (fasting). You may have this done every 1-3 years.  Bone density scan. This is done to screen for osteoporosis. You may have this done starting at age 91.  Mammogram. This may be done every 1-2 years. Talk to your health care provider about how often you should have regular mammograms. Talk with your health care provider about your test results, treatment options, and if necessary, the need for more tests. Vaccines  Your health care provider may recommend certain vaccines, such as:  Influenza vaccine. This is  recommended every year.  Tetanus, diphtheria, and acellular pertussis (Tdap, Td) vaccine. You may need a Td booster every 10 years.  Zoster vaccine. You may need this  after age 64.  Pneumococcal 13-valent conjugate (PCV13) vaccine. One dose is recommended after age 63.  Pneumococcal polysaccharide (PPSV23) vaccine. One dose is recommended after age 41. Talk to your health care provider about which screenings and vaccines you need and how often you need them. This information is not intended to replace advice given to you by your health care provider. Make sure you discuss any questions you have with your health care provider. Document Released: 06/25/2015 Document Revised: 02/16/2016 Document Reviewed: 03/30/2015 Elsevier Interactive Patient Education  2017 [place] Prevention in the Home Falls can cause injuries. They can happen to people of all ages. There are many things you can do to make your home safe and to help prevent falls. What can I do on the outside of my home?  Regularly fix the edges of walkways and driveways and fix any cracks.  Remove anything that might make you trip as you walk through a door, such as a raised step or threshold.  Trim any bushes or trees on the path to your home.  Use bright outdoor lighting.  Clear any walking paths of anything that might make someone trip, such as rocks or tools.  Regularly check to see if handrails are loose or broken. Make sure that both sides of any steps have handrails.  Any raised decks and porches should have guardrails on the edges.  Have any leaves, snow, or ice cleared regularly.  Use sand or salt on walking paths during winter.  Clean up any spills in your garage right away. This includes oil or grease spills. What can I do in the bathroom?  Use night lights.  Install grab bars by the toilet and in the tub and shower. Do not use towel bars as grab bars.  Use non-skid mats or decals in the tub or shower.  If you need to sit down in the shower, use a plastic, non-slip stool.  Keep the floor dry. Clean up any water that spills on the floor as soon as it  happens.  Remove soap buildup in the tub or shower regularly.  Attach bath mats securely with double-sided non-slip rug tape.  Do not have throw rugs and other things on the floor that can make you trip. What can I do in the bedroom?  Use night lights.  Make sure that you have a light by your bed that is easy to reach.  Do not use any sheets or blankets that are too big for your bed. They should not hang down onto the floor.  Have a firm chair that has side arms. You can use this for support while you get dressed.  Do not have throw rugs and other things on the floor that can make you trip. What can I do in the kitchen?  Clean up any spills right away.  Avoid walking on wet floors.  Keep items that you use a lot in easy-to-reach places.  If you need to reach something above you, use a strong step stool that has a grab bar.  Keep electrical cords out of the way.  Do not use floor polish or wax that makes floors slippery. If you must use wax, use non-skid floor wax.  Do not have throw rugs and other things on the floor that can make you trip.  What can I do with my stairs?  Do not leave any items on the stairs.  Make sure that there are handrails on both sides of the stairs and use them. Fix handrails that are broken or loose. Make sure that handrails are as long as the stairways.  Check any carpeting to make sure that it is firmly attached to the stairs. Fix any carpet that is loose or worn.  Avoid having throw rugs at the top or bottom of the stairs. If you do have throw rugs, attach them to the floor with carpet tape.  Make sure that you have a light switch at the top of the stairs and the bottom of the stairs. If you do not have them, ask someone to add them for you. What else can I do to help prevent falls?  Wear shoes that:  Do not have high heels.  Have rubber bottoms.  Are comfortable and fit you well.  Are closed at the toe. Do not wear sandals.  If you  use a stepladder:  Make sure that it is fully opened. Do not climb a closed stepladder.  Make sure that both sides of the stepladder are locked into place.  Ask someone to hold it for you, if possible.  Clearly mark and make sure that you can see:  Any grab bars or handrails.  First and last steps.  Where the edge of each step is.  Use tools that help you move around (mobility aids) if they are needed. These include:  Canes.  Walkers.  Scooters.  Crutches.  Turn on the lights when you go into a dark area. Replace any light bulbs as soon as they burn out.  Set up your furniture so you have a clear path. Avoid moving your furniture around.  If any of your floors are uneven, fix them.  If there are any pets around you, be aware of where they are.  Review your medicines with your doctor. Some medicines can make you feel dizzy. This can increase your chance of falling. Ask your doctor what other things that you can do to help prevent falls. This information is not intended to replace advice given to you by your health care provider. Make sure you discuss any questions you have with your health care provider. Document Released: 03/25/2009 Document Revised: 11/04/2015 Document Reviewed: 07/03/2014 Elsevier Interactive Patient Education  2017 [employer].

## 2020-01-27 DIAGNOSIS — H2513 Age-related nuclear cataract, bilateral: Secondary | ICD-10-CM | POA: Diagnosis not present

## 2020-01-27 DIAGNOSIS — H52203 Unspecified astigmatism, bilateral: Secondary | ICD-10-CM | POA: Diagnosis not present

## 2020-01-27 DIAGNOSIS — H5211 Myopia, right eye: Secondary | ICD-10-CM | POA: Diagnosis not present

## 2020-02-09 DIAGNOSIS — Z23 Encounter for immunization: Secondary | ICD-10-CM | POA: Diagnosis not present

## 2020-03-10 ENCOUNTER — Other Ambulatory Visit: Payer: Self-pay

## 2020-03-10 ENCOUNTER — Ambulatory Visit (INDEPENDENT_AMBULATORY_CARE_PROVIDER_SITE_OTHER): Payer: Medicare Other | Admitting: Dermatology

## 2020-03-10 DIAGNOSIS — L570 Actinic keratosis: Secondary | ICD-10-CM | POA: Diagnosis not present

## 2020-03-10 DIAGNOSIS — L820 Inflamed seborrheic keratosis: Secondary | ICD-10-CM | POA: Diagnosis not present

## 2020-03-10 DIAGNOSIS — L821 Other seborrheic keratosis: Secondary | ICD-10-CM

## 2020-03-10 DIAGNOSIS — D225 Melanocytic nevi of trunk: Secondary | ICD-10-CM | POA: Diagnosis not present

## 2020-03-10 DIAGNOSIS — D229 Melanocytic nevi, unspecified: Secondary | ICD-10-CM

## 2020-03-10 MED ORDER — TOLAK 4 % EX CREA: TOPICAL_CREAM | CUTANEOUS | 2 refills | Status: DC

## 2020-03-10 NOTE — Patient Instructions (Addendum)
Follow-up for Katherine Blake date of birth January 22, 1946.  This was prompted by some new crusty spots on the chest particular the larger one on the left the chest and neck area.  The larger spot better fits a thick precancer than superficial cancer and was treated with 5-second liquid nitrogen freeze.  If this does not produce smoothness, I have asked Katherine Blake to return and we will consider taking a little biopsy.  Furthermore there is sandpapery roughness on much of her chest as well as some spots on the scalp she will get a prescription for Tolak cream and use this perhaps in January.  She will put two little pea-sized dots of this and use this across the V of the chest either nightly or every other night for a total of twenty-eight applications.  She understands she may see some irritation from this and if the irritation is too much she will take a week off and use it less often.  On the scalp she will try to only treat crusts that are more than several weeks old and she could just spot treat these.  Her back shows no atypical moles or skin cancer.  There is a large benign keratosis in the middle of the back and a smaller flesh-colored benign keratosis on the left angle of the jaw which did not clear with previous freezing, but does not require any current treatment.

## 2020-03-15 DIAGNOSIS — Z23 Encounter for immunization: Secondary | ICD-10-CM | POA: Diagnosis not present

## 2020-03-24 ENCOUNTER — Other Ambulatory Visit: Payer: Self-pay

## 2020-03-24 MED ORDER — NONFORMULARY OR COMPOUNDED ITEM: 3 refills | Status: DC

## 2020-04-13 ENCOUNTER — Encounter: Payer: Self-pay | Admitting: Dermatology

## 2020-04-13 NOTE — Progress Notes (Signed)
   Follow-Up Visit   Subjective  Katherine Blake is a 74 y.o. female who presents for the following: Skin Problem (Has some spots on chest. Scaley spots that dont heal/go away. Last visit she got Fluocinolone sol. Did not help. Also has spot on right lower leg. red spot not healing. Check scalp.).  General skin check Location:  Duration:  Quality:  Associated Signs/Symptoms: Modifying Factors:  Severity:  Timing: Context:   Objective  Well appearing patient in no apparent distress; mood and affect are within normal limits.  All skin waist up examined.Plus lower legs.   Assessment & Plan    AK (actinic keratosis) Left Breast  Destruction of lesion - Left Breast Complexity: simple   Destruction method: cryotherapy   Informed consent: discussed and consent obtained   Lesion destroyed using liquid nitrogen: Yes   Cryotherapy cycles:  5 Outcome: patient tolerated procedure well with no complications    Nevus Mid Back  Self examine twice annually, dermatologist examination yearly  Seborrheic keratosis (2) Mid Back; Left Parotid Area  May leave if stable.  Inflamed seborrheic keratosis Right Lower Leg - Anterior  Biopsy if grows or bleeds  Follow-up for Katherine Blake date of birth 06-06-1946.  This was prompted by some new crusty spots on the chest particular the larger one on the left the chest and neck area.  The larger spot better fits a thick precancer than superficial cancer and was treated with 5-second liquid nitrogen freeze.  If this does not produce smoothness, I have asked Katherine Blake to return and we will consider taking a little biopsy.  Furthermore there is sandpapery roughness on much of her chest as well as some spots on the scalp she will get a prescription for Tolak cream and use this perhaps in January.  She will put two little pea-sized dots of this and use this across the V of the chest either nightly or every other night for a total of twenty-eight  applications.  She understands she may see some irritation from this and if the irritation is too much she will take a week off and use it less often.  On the scalp she will try to only treat crusts that are more than several weeks old and she could just spot treat these.  Her back shows no atypical moles or skin cancer.  There is a large benign keratosis in the middle of the back and a smaller flesh-colored benign keratosis on the left angle of the jaw which did not clear with previous freezing, but does not require any current treatment.   I, Katherine Monarch, MD, have reviewed all documentation for this visit.  The documentation on 04/13/20 for the exam, diagnosis, procedures, and orders are all accurate and complete.

## 2020-05-24 ENCOUNTER — Encounter: Payer: Self-pay | Admitting: Family Medicine

## 2020-05-24 ENCOUNTER — Other Ambulatory Visit: Payer: Self-pay

## 2020-05-24 ENCOUNTER — Ambulatory Visit (INDEPENDENT_AMBULATORY_CARE_PROVIDER_SITE_OTHER): Payer: Medicare Other | Admitting: Family Medicine

## 2020-05-24 VITALS — BP 118/70 | HR 81 | Temp 97.7°F | Resp 17 | Ht 62.0 in | Wt 148.4 lb

## 2020-05-24 DIAGNOSIS — E663 Overweight: Secondary | ICD-10-CM | POA: Diagnosis not present

## 2020-05-24 DIAGNOSIS — E785 Hyperlipidemia, unspecified: Secondary | ICD-10-CM

## 2020-05-24 LAB — LIPID PANEL
Cholesterol: 239 mg/dL — ABNORMAL HIGH (ref 0–200)
HDL: 69.4 mg/dL (ref >39.00)
LDL Cholesterol: 142 mg/dL — ABNORMAL HIGH (ref 0–99)
NonHDL: 169.52
Total CHOL/HDL Ratio: 3
Triglycerides: 137 mg/dL (ref 0.0–149.0)
VLDL: 27.4 mg/dL (ref 0.0–40.0)

## 2020-05-24 LAB — CBC WITH DIFFERENTIAL/PLATELET
Basophils Absolute: 0.1 10*3/uL (ref 0.0–0.1)
Basophils Relative: 1.4 % (ref 0.0–3.0)
Eosinophils Absolute: 0.1 10*3/uL (ref 0.0–0.7)
Eosinophils Relative: 2.8 % (ref 0.0–5.0)
HCT: 38.5 % (ref 36.0–46.0)
Hemoglobin: 12.7 g/dL (ref 12.0–15.0)
Lymphocytes Relative: 32.7 % (ref 12.0–46.0)
Lymphs Abs: 1.5 10*3/uL (ref 0.7–4.0)
MCHC: 33.1 g/dL (ref 30.0–36.0)
MCV: 88.7 fl (ref 78.0–100.0)
Monocytes Absolute: 0.3 10*3/uL (ref 0.1–1.0)
Monocytes Relative: 7 % (ref 3.0–12.0)
Neutro Abs: 2.5 10*3/uL (ref 1.4–7.7)
Neutrophils Relative %: 56.1 % (ref 43.0–77.0)
Platelets: 279 10*3/uL (ref 150.0–400.0)
RBC: 4.33 Mil/uL (ref 3.87–5.11)
RDW: 13.5 % (ref 11.5–15.5)
WBC: 4.4 10*3/uL (ref 4.0–10.5)

## 2020-05-24 LAB — BASIC METABOLIC PANEL
BUN: 15 mg/dL (ref 6–23)
CO2: 29 mEq/L (ref 19–32)
Calcium: 9.1 mg/dL (ref 8.4–10.5)
Chloride: 104 mEq/L (ref 96–112)
Creatinine, Ser: 0.67 mg/dL (ref 0.40–1.20)
GFR: 87.14 mL/min (ref >60.00)
Glucose, Bld: 93 mg/dL (ref 70–99)
Potassium: 4 mEq/L (ref 3.5–5.1)
Sodium: 139 mEq/L (ref 135–145)

## 2020-05-24 LAB — HEPATIC FUNCTION PANEL
ALT: 22 U/L (ref 0–35)
AST: 17 U/L (ref 0–37)
Albumin: 4.4 g/dL (ref 3.5–5.2)
Alkaline Phosphatase: 53 U/L (ref 39–117)
Bilirubin, Direct: 0.1 mg/dL (ref 0.0–0.3)
Total Bilirubin: 0.5 mg/dL (ref 0.2–1.2)
Total Protein: 6.6 g/dL (ref 6.0–8.3)

## 2020-05-24 LAB — TSH: TSH: 2.2 u[IU]/mL (ref 0.35–4.50)

## 2020-05-24 NOTE — Progress Notes (Signed)
   Subjective:    Patient ID: Katherine Blake, female    DOB: 09/16/1945, 74 y.o.   MRN: [id number]  HPI Hyperlipidemia- Last LDL 135.  Attempting to control w/ diet and exercise.  No CP, SOB, blurry/double vision.  No abd pain, N/V.  No dizziness  Overweight- BMI 27.14.  Does yard work, walks regularly.  Health Maintenance- UTD on mammo, cologuard, Pneumonia vaccines, COVID (including booster), and flu.  UTD on DEXA   Review of Systems For ROS see HPI   This visit occurred during the SARS-CoV-2 public health emergency.  Safety protocols were in place, including screening questions prior to the visit, additional usage of staff PPE, and extensive cleaning of exam room while observing appropriate contact time as indicated for disinfecting solutions.       Objective:   Physical Exam Vitals reviewed.  Constitutional:      General: She is not in acute distress.    Appearance: Normal appearance. She is well-developed and well-nourished.  HENT:     Head: Normocephalic and atraumatic.  Eyes:     Extraocular Movements: EOM normal.     Conjunctiva/sclera: Conjunctivae normal.     Pupils: Pupils are equal, round, and reactive to light.  Neck:     Thyroid: No thyromegaly.  Cardiovascular:     Rate and Rhythm: Normal rate and regular rhythm.     Pulses: Intact distal pulses.     Heart sounds: Normal heart sounds. No murmur heard.   Pulmonary:     Effort: Pulmonary effort is normal. No respiratory distress.     Breath sounds: Normal breath sounds.  Abdominal:     General: There is no distension.     Palpations: Abdomen is soft.     Tenderness: There is no abdominal tenderness.  Musculoskeletal:        General: No edema.     Cervical back: Normal range of motion and neck supple.     Right lower leg: No edema.     Left lower leg: No edema.  Lymphadenopathy:     Cervical: No cervical adenopathy.  Skin:    General: Skin is warm and dry.  Neurological:     Mental Status: She is  alert and oriented to person, place, and time.  Psychiatric:        Mood and Affect: Mood and affect normal.        Behavior: Behavior normal.           Assessment & Plan:

## 2020-05-24 NOTE — Assessment & Plan Note (Signed)
Chronic problem.  Has been able to control w/ diet and exercise to this point.  Check labs and determine if medication is needed.

## 2020-05-24 NOTE — Patient Instructions (Signed)
Follow up in 1 year or as needed We'll notify you of your lab results and make any changes if needed Continue to work on healthy diet and regular exercise- you're doing great!!! Call with any questions or concerns Stay Safe!  Stay Healthy!! Happy Holidays!!! 

## 2020-05-24 NOTE — Assessment & Plan Note (Signed)
BMI is 27.14  Pt is exercising regularly.  Check labs to risk stratify.  Encouraged healthy diet.  Will follow.

## 2020-05-25 ENCOUNTER — Encounter: Payer: Self-pay | Admitting: Family Medicine

## 2020-06-23 ENCOUNTER — Telehealth: Payer: Self-pay | Admitting: Dermatology

## 2020-06-23 NOTE — Telephone Encounter (Signed)
Patient left message on office voice mail saying that she is using a prescription that [name], MD (ST) prescribed for her chest.  Patient says that she is supposed to use it for 28 days, but her chest is very irritated and she is miserable.  Patient said that ST told her if this happened that she could stop the prescription for a week and then restart.  If she does restart is she supposed to add on a week at the end to replace the week that she stopped use or what?  Also patient states that she used a medication previously that took away the pigment on her chest and she does not want that to happen again.

## 2020-06-23 NOTE — Telephone Encounter (Signed)
See patient's message.

## 2020-06-23 NOTE — Telephone Encounter (Signed)
   Telephone  06/23/2020 [facility]   [name], MD   Dermatology  Advice Only   Reason for call    Conversation: Advice Only (Newest Message First)    KS  06/23/20 1:23 PM You routed this conversation to [facility]     Me   KS  06/23/20 1:22 PM Note   Patient left message on office voice mail saying that she is using a prescription that [name], MD (ST) prescribed for her chest.  Patient says that she is supposed to use it for 28 days, but her chest is very irritated and she is miserable.  Patient said that ST told her if this happened that she could stop the prescription for a week and then restart.  If she does restart is she supposed to add on a week at the end to replace the week that she stopped use or what?  Also patient states that she used a medication previously that took away the pigment on her chest and she does not want that to happen again.           06/23/20 1:16 PM Wayland Denis contacted Me      Additional Documentation  Encounter Info:  Billing Info,   History,   Allergies,   Detailed Report       Orders Placed   None   Medication Renewals and Changes     None    Medication List   Visit Diagnoses     None    Problem List

## 2020-06-24 NOTE — Telephone Encounter (Signed)
Have her discontinue the Tolak for 1 week.  If she has used it for more than 12 applications, she can stay off the medication and we will judge in 1 to 2 months how much improvement she has.  If she has done less than a dozen applications, once the inflammation dies down have her alternate triamcinolone cream with the Tolak for 2 weeks and then please call us.

## 2020-06-24 NOTE — Telephone Encounter (Signed)
Patient stated she has done 12 applications and she can d/c treatment and follow up 1-2 months.

## 2020-07-13 ENCOUNTER — Encounter: Payer: Self-pay | Admitting: Family Medicine

## 2020-07-23 DIAGNOSIS — H524 Presbyopia: Secondary | ICD-10-CM | POA: Diagnosis not present

## 2020-08-18 DIAGNOSIS — R69 Illness, unspecified: Secondary | ICD-10-CM | POA: Diagnosis not present

## 2020-10-25 ENCOUNTER — Telehealth: Payer: Self-pay | Admitting: *Deleted

## 2020-10-25 NOTE — Progress Notes (Signed)
GYNECOLOGY  VISIT  CC:   Night sweats  HPI: 75 y.o. G35P2002 Married White or Caucasian female here for hot flashes. Used to use Femring for vaginal estrogen Now using compounded vaginal estrogen r/t cost  Experienced a couple months ago woke up really sweaty, sheets wet. Didn't think much of it. But it happened again recently, wondered if related to vaginal estrogen.    Hx HYST/BSO in 1987 (r/t endometriosis)  GYNECOLOGIC HISTORY: No LMP recorded. Patient has had a hysterectomy. Contraception: hysterectomy Menopausal hormone therapy: estradiol vag cream 0.02%  Patient Active Problem List   Diagnosis Date Noted  . Extensor tenosynovitis of right wrist 10/14/2019  . Primary osteoarthritis of first carpometacarpal joint of left hand 10/14/2019  . Hyperlipidemia 02/22/2017  . Overweight (BMI 25.0-29.9) 02/22/2017    Past Medical History:  Diagnosis Date  . Allergy   . History of chicken pox     Past Surgical History:  Procedure Laterality Date  . ABDOMINAL HYSTERECTOMY  1987   TAH,BSO Endometriosis  . CESAREAN SECTION     X 2  . OOPHORECTOMY     BSO  . PELVIC LAPAROSCOPY  1980    MEDS:   Current Outpatient Medications on File Prior to Visit  Medication Sig Dispense Refill  . Calcium Carbonate-Vitamin D (CALCIUM + D PO) Take 1 tablet by mouth daily.     . fluticasone (FLONASE) 50 MCG/ACT nasal spray Place 1 spray into both nostrils every evening.     . Multiple Vitamins-Minerals (CENTRUM SILVER 50+WOMEN PO) Take 1 tablet by mouth daily.     . NONFORMULARY OR COMPOUNDED ITEM Estradiol vag cream 0.02% #64 prefilled applicators Insert applicatorful (1 gram)  twice weekly. 90 each 3  . Red Yeast Rice Extract (RED YEAST RICE PO) Take by mouth.     No current facility-administered medications on file prior to visit.    ALLERGIES: Amoxicillin and Cephalexin  Family History  Problem Relation Age of Onset  . Heart failure Father   . Cancer Maternal Grandmother         Leukemia  . Dementia Mother   . CAD Mother   . Thrombocytopenia Mother   . Other Mother        JAK2 positive     Review of Systems  Constitutional:       Hot flashes  HENT: Negative.   Eyes: Negative.   Respiratory: Negative.   Cardiovascular: Negative.   Gastrointestinal: Negative.   Endocrine: Negative.   Genitourinary: Negative.   Musculoskeletal: Negative.   Skin: Negative.   Allergic/Immunologic: Negative.   Neurological: Negative.   Hematological: Negative.   Psychiatric/Behavioral: Negative.     PHYSICAL EXAMINATION:    BP 118/62   Pulse 80   Resp 15   Wt 147 lb (66.7 kg)   BMI 26.89 kg/m     General appearance: alert, cooperative, no acute distress   Assessment/plan: Hot flashes - Plan: TSH, Comprehensive metabolic panel  Discussed unlikely r/t female hormone One thing patient started in last four months is Red Yeast Rice, wonders if that could be contributor. Advised will look into this more and get back to her.  Discussed other potential causes to hot flashes: caffeine, alcohol, stress, medications   20 minutes of total time was spent for this patient encounter, including preparation, face-to-face counseling with the patient and coordination of care, and documentation of the encounter.

## 2020-10-25 NOTE — Telephone Encounter (Signed)
Patient called c/o hot flashes, using estradiol vaginal cream. Patient wanted Rx, I suggested she schedule OV with Claiborne Billings to discuss. Message sent to appointments to schedule.

## 2020-10-26 ENCOUNTER — Other Ambulatory Visit: Payer: Self-pay

## 2020-10-26 ENCOUNTER — Encounter: Payer: Self-pay | Admitting: Nurse Practitioner

## 2020-10-26 ENCOUNTER — Ambulatory Visit: Payer: Medicare Other | Admitting: Nurse Practitioner

## 2020-10-26 VITALS — BP 118/62 | HR 80 | Resp 15 | Wt 147.0 lb

## 2020-10-26 DIAGNOSIS — R232 Flushing: Secondary | ICD-10-CM

## 2020-10-27 ENCOUNTER — Encounter: Payer: Self-pay | Admitting: Nurse Practitioner

## 2020-10-27 LAB — COMPREHENSIVE METABOLIC PANEL
AG Ratio: 2.4 (calc) (ref 1.0–2.5)
ALT: 15 U/L (ref 6–29)
AST: 18 U/L (ref 10–35)
Albumin: 4.6 g/dL (ref 3.6–5.1)
Alkaline phosphatase (APISO): 52 U/L (ref 37–153)
BUN: 20 mg/dL (ref 7–25)
CO2: 28 mmol/L (ref 20–32)
Calcium: 9.7 mg/dL (ref 8.6–10.4)
Chloride: 104 mmol/L (ref 98–110)
Creat: 0.72 mg/dL (ref 0.60–0.93)
Globulin: 1.9 g/dL (calc) (ref 1.9–3.7)
Glucose, Bld: 82 mg/dL (ref 65–99)
Potassium: 4.4 mmol/L (ref 3.5–5.3)
Sodium: 139 mmol/L (ref 135–146)
Total Bilirubin: 0.5 mg/dL (ref 0.2–1.2)
Total Protein: 6.5 g/dL (ref 6.1–8.1)

## 2020-10-27 LAB — TSH: TSH: 1.48 mIU/L (ref 0.40–4.50)

## 2020-10-28 ENCOUNTER — Encounter: Payer: Self-pay | Admitting: Nurse Practitioner

## 2020-11-29 ENCOUNTER — Ambulatory Visit (INDEPENDENT_AMBULATORY_CARE_PROVIDER_SITE_OTHER): Payer: Medicare Other | Admitting: Family Medicine

## 2020-11-29 ENCOUNTER — Encounter: Payer: Self-pay | Admitting: Family Medicine

## 2020-11-29 ENCOUNTER — Other Ambulatory Visit: Payer: Self-pay

## 2020-11-29 VITALS — BP 115/70 | HR 79 | Temp 97.6°F | Resp 17 | Ht 62.0 in | Wt 146.2 lb

## 2020-11-29 DIAGNOSIS — R002 Palpitations: Secondary | ICD-10-CM | POA: Diagnosis not present

## 2020-11-29 NOTE — Progress Notes (Signed)
   Subjective:    Patient ID: Katherine Blake, female    DOB: 10-Aug-1945, 75 y.o.   MRN: [id number]  HPI Heart palpitations- son was visiting this weekend w/ near 4 yr old and yesterday morning had palpitations that 'lasted longer than I thought they should'.  Lasted 5-10 minutes in the morning.  Then had a repeat episode while lying in bed- another 5 minute spell.  No SOB.  No dizziness.  Pt reports being tired due to keeping her granddaughter.  Reports large amount of iced tea recently.  Pt has hx of palpitations.  Pt has been asymptomatic today- walked 3 miles and was doing yard work earlier today.   Review of Systems For ROS see HPI   This visit occurred during the SARS-CoV-2 public health emergency.  Safety protocols were in place, including screening questions prior to the visit, additional usage of staff PPE, and extensive cleaning of exam room while observing appropriate contact time as indicated for disinfecting solutions.      Objective:   Physical Exam Vitals reviewed.  Constitutional:      General: She is not in acute distress.    Appearance: Normal appearance. She is well-developed. She is not ill-appearing.  HENT:     Head: Normocephalic and atraumatic.  Eyes:     Conjunctiva/sclera: Conjunctivae normal.     Pupils: Pupils are equal, round, and reactive to light.  Neck:     Thyroid: No thyromegaly.  Cardiovascular:     Rate and Rhythm: Normal rate and regular rhythm.     Pulses: Normal pulses.     Heart sounds: Normal heart sounds. No murmur heard. Pulmonary:     Effort: Pulmonary effort is normal. No respiratory distress.     Breath sounds: Normal breath sounds.  Abdominal:     General: There is no distension.     Palpations: Abdomen is soft.     Tenderness: There is no abdominal tenderness.  Musculoskeletal:     Cervical back: Normal range of motion and neck supple.     Right lower leg: No edema.     Left lower leg: No edema.  Lymphadenopathy:     Cervical: No  cervical adenopathy.  Skin:    General: Skin is warm and dry.  Neurological:     Mental Status: She is alert and oriented to person, place, and time.  Psychiatric:        Behavior: Behavior normal.          Assessment & Plan:  Palpitations- pt reports she has had similar sxs previously but feels these are more frequent, longer lasting, and more intense.  She does admit to increased amounts of caffeine with sweet tea.  + fatigue.  Denies anxiety.  EKG WNL.  Check labs to r/o underlying cause.  Encouraged her to keep a log of her sxs to see if we can establish a pattern.  If no pattern and sxs continue, will refer to Cards.  Pt expressed understanding and is in agreement w/ plan.

## 2020-11-29 NOTE — Patient Instructions (Signed)
We'll notify you of your lab results and make any changes if needed Try and keep a log of your symptoms- when they occur, how long they last, if anything makes them worse/better.  After 1 week, send me a MyChart message and we'll see if we can find any patterns Try and limit your caffeine intake Get plenty of rest! Call with any questions or concerns Hang in there!

## 2020-11-30 ENCOUNTER — Emergency Department (HOSPITAL_COMMUNITY)
Admission: EM | Admit: 2020-11-30 | Discharge: 2020-11-30 | Disposition: A | Discharge status: Home / Self Care | Payer: Medicare Other | Attending: Emergency Medicine | Admitting: Emergency Medicine

## 2020-11-30 ENCOUNTER — Emergency Department (HOSPITAL_COMMUNITY): Payer: Medicare Other

## 2020-11-30 ENCOUNTER — Encounter (HOSPITAL_COMMUNITY): Payer: Self-pay | Admitting: Emergency Medicine

## 2020-11-30 ENCOUNTER — Other Ambulatory Visit: Payer: Self-pay

## 2020-11-30 DIAGNOSIS — R002 Palpitations: Secondary | ICD-10-CM | POA: Diagnosis not present

## 2020-11-30 LAB — CBC
HCT: 39.8 % (ref 36.0–46.0)
Hemoglobin: 12.8 g/dL (ref 12.0–15.0)
MCH: 29.3 pg (ref 26.0–34.0)
MCHC: 32.2 g/dL (ref 30.0–36.0)
MCV: 91.1 fL (ref 80.0–100.0)
Platelets: 311 10*3/uL (ref 150–400)
RBC: 4.37 MIL/uL (ref 3.87–5.11)
RDW: 13 % (ref 11.5–15.5)
WBC: 5.4 10*3/uL (ref 4.0–10.5)
nRBC: 0 % (ref 0.0–0.2)

## 2020-11-30 LAB — CBC WITH DIFFERENTIAL/PLATELET
Basophils Absolute: 0.1 10*3/uL (ref 0.0–0.1)
Basophils Relative: 1.1 % (ref 0.0–3.0)
Eosinophils Absolute: 0.1 10*3/uL (ref 0.0–0.7)
Eosinophils Relative: 1.8 % (ref 0.0–5.0)
HCT: 37.9 % (ref 36.0–46.0)
Hemoglobin: 12.8 g/dL (ref 12.0–15.0)
Lymphocytes Relative: 34.1 % (ref 12.0–46.0)
Lymphs Abs: 1.9 10*3/uL (ref 0.7–4.0)
MCHC: 33.7 g/dL (ref 30.0–36.0)
MCV: 88.1 fl (ref 78.0–100.0)
Monocytes Absolute: 0.4 10*3/uL (ref 0.1–1.0)
Monocytes Relative: 6.9 % (ref 3.0–12.0)
Neutro Abs: 3.2 10*3/uL (ref 1.4–7.7)
Neutrophils Relative %: 56.1 % (ref 43.0–77.0)
Platelets: 276 10*3/uL (ref 150.0–400.0)
RBC: 4.31 Mil/uL (ref 3.87–5.11)
RDW: 13.1 % (ref 11.5–15.5)
WBC: 5.6 10*3/uL (ref 4.0–10.5)

## 2020-11-30 LAB — BASIC METABOLIC PANEL
Anion gap: 7 (ref 5–15)
BUN: 23 mg/dL (ref 6–23)
BUN: 20 mg/dL (ref 8–23)
CO2: 27 mEq/L (ref 19–32)
CO2: 25 mmol/L (ref 22–32)
Calcium: 9.8 mg/dL (ref 8.4–10.5)
Calcium: 9.6 mg/dL (ref 8.9–10.3)
Chloride: 104 mEq/L (ref 96–112)
Chloride: 108 mmol/L (ref 98–111)
Creatinine, Ser: 0.64 mg/dL (ref 0.40–1.20)
Creatinine, Ser: 0.69 mg/dL (ref 0.44–1.00)
GFR, Estimated: >60 mL/min (ref >60)
GFR: 87.79 mL/min (ref >60.00)
Glucose, Bld: 77 mg/dL (ref 70–99)
Glucose, Bld: 95 mg/dL (ref 70–99)
Potassium: 4 mEq/L (ref 3.5–5.1)
Potassium: 3.9 mmol/L (ref 3.5–5.1)
Sodium: 139 mEq/L (ref 135–145)
Sodium: 140 mmol/L (ref 135–145)

## 2020-11-30 LAB — HEPATIC FUNCTION PANEL
ALT: 15 U/L (ref 0–35)
AST: 18 U/L (ref 0–37)
Albumin: 4.5 g/dL (ref 3.5–5.2)
Alkaline Phosphatase: 49 U/L (ref 39–117)
Bilirubin, Direct: 0.1 mg/dL (ref 0.0–0.3)
Total Bilirubin: 0.5 mg/dL (ref 0.2–1.2)
Total Protein: 6.6 g/dL (ref 6.0–8.3)

## 2020-11-30 LAB — LIPID PANEL
Cholesterol: 196 mg/dL (ref 0–200)
HDL: 76.5 mg/dL (ref >39.00)
LDL Cholesterol: 92 mg/dL (ref 0–99)
NonHDL: 119.18
Total CHOL/HDL Ratio: 3
Triglycerides: 134 mg/dL (ref 0.0–149.0)
VLDL: 26.8 mg/dL (ref 0.0–40.0)

## 2020-11-30 LAB — TROPONIN I (HIGH SENSITIVITY): Troponin I (High Sensitivity): 8 ng/L (ref <18)

## 2020-11-30 LAB — TSH: TSH: 1.84 u[IU]/mL (ref 0.35–4.50)

## 2020-11-30 NOTE — Discharge Instructions (Addendum)
Please return to the ED with any new or concerning symptoms.   Follow up with cardiology as planned. You should be contacted by their office in the next 1-2 days. If you have not heard from them, contact the office to schedule an appointment.

## 2020-11-30 NOTE — ED Provider Notes (Signed)
[facility] EMERGENCY DEPARTMENT Provider Note   CSN: [id number] Arrival date & time: 11/30/20  1938     History Chief Complaint  Patient presents with   Palpitations    Katherine Blake is a 75 y.o. female.  Patient to ED for evaluation of fast-rate palpitations lasting over 1 hour tonight. She has been seen this week by her PCP for same and had blood tests that were reported as normal. She came tonight because the symptoms lasted longer than they have previously. No chest pain, SOB, nausea, near syncope or syncope. No history of blood clots or risk factors. She states her thyroid was checked by her doctor and was normal. Her symptoms have resolved.   The history is provided by the patient. No language interpreter was used.  Palpitations Associated symptoms: no chest pain, no nausea, no shortness of breath and no weakness       Past Medical History:  Diagnosis Date   Allergy    History of chicken pox     Patient Active Problem List   Diagnosis Date Noted   Extensor tenosynovitis of right wrist 10/14/2019   Primary osteoarthritis of first carpometacarpal joint of left hand 10/14/2019   Hyperlipidemia 02/22/2017   Overweight (BMI 25.0-29.9) 02/22/2017    Past Surgical History:  Procedure Laterality Date   ABDOMINAL HYSTERECTOMY  1987   TAH,BSO Endometriosis   CESAREAN SECTION     X 2   OOPHORECTOMY     BSO   PELVIC LAPAROSCOPY  1980     OB History     Gravida  2   Para  2   Term  2   Preterm      AB  0   Living  2      SAB      IAB      Ectopic      Multiple      Live Births              Family History  Problem Relation Age of Onset   Heart failure Father    Cancer Maternal Grandmother        Leukemia   Dementia Mother    CAD Mother    Thrombocytopenia Mother    Other Mother        JAK2 positive    Social History   Tobacco Use   Smoking status: Never   Smokeless tobacco: Never  Vaping Use   Vaping Use:  Never used  Substance Use Topics   Alcohol use: Not Currently    Alcohol/week: 0.0 standard drinks   Drug use: Never    Home Medications Prior to Admission medications   Medication Sig Start Date End Date Taking? Authorizing Provider  Calcium Carbonate-Vitamin D (CALCIUM + D PO) Take 1 tablet by mouth daily.     [provider]  fluticasone (FLONASE) 50 MCG/ACT nasal spray Place 1 spray into both nostrils every evening.  08/11/16   [provider]  NONFORMULARY OR COMPOUNDED ITEM Estradiol vag cream 0.02% #47 prefilled applicators Insert applicatorful (1 gram)  twice weekly. 03/24/20   Joseph Pierini, MD  Red Yeast Rice Extract (RED YEAST RICE PO) Take by mouth.    [provider]    Allergies    Amoxicillin and Cephalexin  Review of Systems   Review of Systems  Constitutional:  Negative for chills and fever.  HENT: Negative.    Respiratory: Negative.  Negative for shortness of breath.  Cardiovascular:  Positive for palpitations. Negative for chest pain and leg swelling.  Gastrointestinal: Negative.  Negative for nausea.  Musculoskeletal: Negative.   Skin: Negative.   Neurological: Negative.  Negative for syncope, weakness and light-headedness.   Physical Exam Updated Vital Signs BP 120/69   Pulse 83   Temp 98.4 F (36.9 C)   Resp 16   SpO2 98%   Physical Exam Vitals and nursing note reviewed.  Constitutional:      General: She is not in acute distress.    Appearance: Normal appearance. She is well-developed. She is not ill-appearing.  HENT:     Head: Normocephalic.  Neck:     Vascular: No carotid bruit.  Cardiovascular:     Rate and Rhythm: Normal rate and regular rhythm.     Heart sounds: No murmur heard. Pulmonary:     Effort: Pulmonary effort is normal.     Breath sounds: Normal breath sounds. No wheezing, rhonchi or rales.  Abdominal:     General: Bowel sounds are normal.     Palpations: Abdomen is soft.     Tenderness: There  is no abdominal tenderness. There is no guarding or rebound.  Musculoskeletal:        General: No swelling. Normal range of motion.     Cervical back: Normal range of motion and neck supple.     Right lower leg: No edema.     Left lower leg: No edema.  Skin:    General: Skin is warm and dry.  Neurological:     General: No focal deficit present.     Mental Status: She is alert and oriented to person, place, and time.    ED Results / Procedures / Treatments   Labs (all labs ordered are listed, but only abnormal results are displayed) Labs Reviewed  BASIC METABOLIC PANEL  CBC  TROPONIN I (HIGH SENSITIVITY)  TROPONIN I (HIGH SENSITIVITY)   Results for orders placed or performed during the hospital encounter of 02/54/27  Basic metabolic panel  Result Value Ref Range   Sodium 140 135 - 145 mmol/L   Potassium 3.9 3.5 - 5.1 mmol/L   Chloride 108 98 - 111 mmol/L   CO2 25 22 - 32 mmol/L   Glucose, Bld 95 70 - 99 mg/dL   BUN 20 8 - 23 mg/dL   Creatinine, Ser 0.69 0.44 - 1.00 mg/dL   Calcium 9.6 8.9 - 10.3 mg/dL   GFR, Estimated >60 >60 mL/min   Anion gap 7 5 - 15  CBC  Result Value Ref Range   WBC 5.4 4.0 - 10.5 K/uL   RBC 4.37 3.87 - 5.11 MIL/uL   Hemoglobin 12.8 12.0 - 15.0 g/dL   HCT 39.8 36.0 - 46.0 %   MCV 91.1 80.0 - 100.0 fL   MCH 29.3 26.0 - 34.0 pg   MCHC 32.2 30.0 - 36.0 g/dL   RDW 13.0 11.5 - 15.5 %   Platelets 311 150 - 400 K/uL   nRBC 0.0 0.0 - 0.2 %  Troponin I (High Sensitivity)  Result Value Ref Range   Troponin I (High Sensitivity) 8 <18 ng/L     EKG None EKG: normal EKG, normal sinus rhythm, unchanged from previous tracings.  Radiology DG Chest 2 View  Result Date: 11/30/2020 CLINICAL DATA:  Palpitations for 5 days. EXAM: CHEST - 2 VIEW COMPARISON:  10/20/16 FINDINGS: The heart size and mediastinal contours are within normal limits. Both lungs are clear. The visualized skeletal structures are unremarkable.  IMPRESSION: No active cardiopulmonary  disease. Electronically Signed   By: Kerby Moors M.D.   On: 11/30/2020 22:56    Procedures Procedures   Medications Ordered in ED Medications - No data to display  ED Course  I have reviewed the triage vital signs and the nursing notes.  Pertinent labs & imaging results that were available during my care of the patient were reviewed by me and considered in my medical decision making (see chart for details).    MDM Rules/Calculators/A&P                          Patient to ED with ss/sxs as per HPI. Currently asymptomatic.   EKG is NSR, rate 70. Physical exam is unremarkable. No evidence ischemia, infection, no suspicion for PE. Patient was discussed with Dr. Dina Rich who reviewed encounter.   Feel the patient can be discharged home with ambulatory referral to cardiology for further evaluation. Return precautions discussed, specifically she should return to the ED with any new or concerning symptoms. The patient is felt reliable to return and reports she is comfortable with plan of discharge with outpatient cards follow up.   Final Clinical Impression(s) / ED Diagnoses Final diagnoses:  Palpitations    Rx / DC Orders ED Discharge Orders          Ordered    Ambulatory referral to Cardiology       Comments: Palpitations   11/30/20 2252             Charlann Lange, PA-C 12/01/20 0405    Horton, Alvin Critchley, DO 12/01/20 2343

## 2020-11-30 NOTE — ED Triage Notes (Signed)
Pt c/o palpitations for the past few days seen by pcp and send here for further evaluation.

## 2020-12-01 ENCOUNTER — Encounter: Payer: Self-pay | Admitting: Family Medicine

## 2020-12-03 ENCOUNTER — Encounter: Payer: Self-pay | Admitting: Family Medicine

## 2020-12-03 MED ORDER — CARDIAC DEFIBRILLATORS KIT: PACK | 0 refills | Status: AC

## 2020-12-03 NOTE — Telephone Encounter (Signed)
Pt is aware that this has been emailed and there is a copy at the front desk for the pt to pick up.

## 2020-12-06 ENCOUNTER — Encounter: Payer: Self-pay | Admitting: Family Medicine

## 2020-12-08 ENCOUNTER — Encounter: Payer: Self-pay | Admitting: *Deleted

## 2020-12-20 DIAGNOSIS — Z1231 Encounter for screening mammogram for malignant neoplasm of breast: Secondary | ICD-10-CM | POA: Diagnosis not present

## 2020-12-20 LAB — HM MAMMOGRAPHY

## 2020-12-21 ENCOUNTER — Other Ambulatory Visit: Payer: Self-pay

## 2020-12-21 ENCOUNTER — Encounter: Payer: Self-pay | Admitting: Nurse Practitioner

## 2020-12-21 ENCOUNTER — Ambulatory Visit (INDEPENDENT_AMBULATORY_CARE_PROVIDER_SITE_OTHER): Payer: Medicare Other | Admitting: Nurse Practitioner

## 2020-12-21 ENCOUNTER — Ambulatory Visit: Payer: Medicare Other | Admitting: Nurse Practitioner

## 2020-12-21 VITALS — BP 134/68 | HR 89 | Ht 61.25 in | Wt 148.0 lb

## 2020-12-21 DIAGNOSIS — N952 Postmenopausal atrophic vaginitis: Secondary | ICD-10-CM

## 2020-12-21 DIAGNOSIS — Z90710 Acquired absence of both cervix and uterus: Secondary | ICD-10-CM

## 2020-12-21 DIAGNOSIS — Z01419 Encounter for gynecological examination (general) (routine) without abnormal findings: Secondary | ICD-10-CM | POA: Diagnosis not present

## 2020-12-21 DIAGNOSIS — Z90722 Acquired absence of ovaries, bilateral: Secondary | ICD-10-CM

## 2020-12-21 DIAGNOSIS — Z9079 Acquired absence of other genital organ(s): Secondary | ICD-10-CM

## 2020-12-21 MED ORDER — NONFORMULARY OR COMPOUNDED ITEM: 3 refills | Status: DC

## 2020-12-21 NOTE — Progress Notes (Signed)
   Katherine Blake 1945-12-20 [id number]   History:  75 y.o. G2P2002 presents for breast and pelvic exam. No GYN complaints. 1987 TAH BSO for endometriosis, no HRT. She does use vaginal estrogen for dryness. Normal pap and mammogram history.   Gynecologic History No LMP recorded. Patient has had a hysterectomy.   Contraception/Family planning: status post hysterectomy  Health Maintenance Last Pap: 11/19/2018. Results were: Normal Last mammogram: 12/20/2020. Results were: No report yet Last colonoscopy: 2012. Results were: Normal. Negative Cologuard 2021 Last Dexa: 12/30/2019. Results were: Normal  Past medical history, past surgical history, family history and social history were all reviewed and documented in the EPIC chart. Married.   ROS:  A ROS was performed and pertinent positives and negatives are included.  Exam:  Vitals:   12/21/20 1429  BP: 134/68  Pulse: 89  SpO2: 98%  Weight: 148 lb (67.1 kg)  Height: 5' 1.25" (1.556 m)   Body mass index is 27.74 kg/m.  General appearance:  Normal Thyroid:  Symmetrical, normal in size, without palpable masses or nodularity. Respiratory  Auscultation:  Clear without wheezing or rhonchi Cardiovascular  Auscultation:  Regular rate, without rubs, murmurs or gallops  Edema/varicosities:  Not grossly evident Abdominal  Soft,nontender, without masses, guarding or rebound.  Liver/spleen:  No organomegaly noted  Hernia:  None appreciated  Skin  Inspection:  Grossly normal Breasts: Examined lying and sitting.   Right: Without masses, retractions, nipple discharge or axillary adenopathy.   Left: Without masses, retractions, nipple discharge or axillary adenopathy. Genitourinary   Inguinal/mons:  Normal without inguinal adenopathy  External genitalia:  Normal appearing vulva with no masses, tenderness, or lesions  BUS/Urethra/Skene's glands:  Normal  Vagina:  Normal appearing with normal color and discharge, no lesions  Cervix:   Absent  Uterus:  Absent  Anus and perineum: Normal  Digital rectal exam: Normal sphincter tone without palpated masses or tenderness  Patient informed chaperone available to be present for breast and pelvic exam. Patient has requested no chaperone to be present. Patient has been advised what will be completed during breast and pelvic exam.   Assessment/Plan:  75 y.o. G2P2002 for annual exam.   Well female exam with routine gynecological exam - Education provided on SBEs, importance of preventative screenings, current guidelines, high calcium diet, regular exercise, and multivitamin daily. Labs with PCP.   History of total abdominal hysterectomy and bilateral salpingo-oophorectomy - 1987 for endometriosis, no HRT.   Postmenopausal atrophic vaginitis - Plan: NONFORMULARY OR COMPOUNDED ITEM. Using compounded vaginal estrogen twice weekly with good management. She would like to continue. Refill provided.   Screening for cervical cancer - Normal Pap history.  Discussed the option to stop screening per guidelines and she is agreeable.   Screening for breast cancer - Normal mammogram history.  Continue annual screenings.  Normal breast exam today.  Screening for colon cancer - 2012 colonoscopy. 2021 negative Cologuard.   Return in 1 year for breast and pelvic exam.    Tamela Gammon DNP, 2:49 PM 12/21/2020

## 2021-01-06 NOTE — Progress Notes (Signed)
Chief Complaint  Patient presents with   New Patient (Initial Visit)    Palpitations   History of Present Illness: 75 yo female with history of palpitations here today as a new consult, referred by Dr. Birdie Riddle, for further evaluation of her palpitations. She has no long term medial issues. She has been noticing palpitations for the last six weeks. The episodes last for about 15 minutes. She was seen in primary care and labs and EKG were ok. She can feel her heart racing. Her Fitbit registered a heart rate of 146 while sleeping. She has had several episodes of heart rates into the 160s. She was seen in the ED several weeks ago. No chest pain, dizziness, near syncope, decrease in energy level, dyspnea. TSH normal in primary care. EKG from primary care reviewed and shows sinus rhythm with no pre-excitation.   Primary Care Physician: Midge Minium, MD  Past Medical History:  Diagnosis Date   Allergy    History of chicken pox     Past Surgical History:  Procedure Laterality Date   ABDOMINAL HYSTERECTOMY  1987   TAH,BSO Endometriosis   CESAREAN SECTION     X 2   OOPHORECTOMY     BSO   PELVIC LAPAROSCOPY  1980    Current Outpatient Medications  Medication Sig Dispense Refill   Calcium Carbonate-Vitamin D (CALCIUM + D PO) Take 1 tablet by mouth daily.      Cardiac Defibrillators KIT Only to use in emergencies- unresponsiveness or cardiac distress 1 kit 0   fluticasone (FLONASE) 50 MCG/ACT nasal spray Place 1 spray into both nostrils every evening.      NONFORMULARY OR COMPOUNDED ITEM Estradiol vag cream 0.02% #58 prefilled applicators Insert 1 gram twice weekly. 90 each 3   Red Yeast Rice Extract (RED YEAST RICE PO) Take by mouth.     No current facility-administered medications for this visit.    Allergies  Allergen Reactions   Amoxicillin Diarrhea   Cephalexin Rash    Social History   Socioeconomic History   Marital status: Married    Spouse name: Not on file    Number of children: 2   Years of education: Not on file   Highest education level: Not on file  Occupational History   Occupation: REtired   Occupation: Teacher/Bookkeeper  Tobacco Use   Smoking status: Never   Smokeless tobacco: Never  Vaping Use   Vaping Use: Never used  Substance and Sexual Activity   Alcohol use: Not Currently    Alcohol/week: 0.0 standard drinks   Drug use: Never   Sexual activity: Yes    Birth control/protection: Surgical  Other Topics Concern   Not on file  Social History Narrative   Not on file   Social Determinants of Health   Financial Resource Strain: Not on file  Food Insecurity: Not on file  Transportation Needs: Not on file  Physical Activity: Not on file  Stress: Not on file  Social Connections: Not on file  Intimate Partner Violence: Not on file    Family History  Problem Relation Age of Onset   Dementia Mother    Thrombocytopenia Mother    Other Mother        JAK2 positive   Heart failure Father    Cancer Maternal Grandmother        Leukemia    Review of Systems:  As stated in the HPI and otherwise negative.   BP 120/66   Pulse 79  Ht 5' 1.25" (1.556 m)   Wt 148 lb 12.8 oz (67.5 kg)   SpO2 99%   BMI 27.89 kg/m   Physical Examination: General: Well developed, well nourished, NAD  HEENT: OP clear, mucus membranes moist  SKIN: warm, dry. No rashes. Neuro: No focal deficits  Musculoskeletal: Muscle strength 5/5 all ext  Psychiatric: Mood and affect normal  Neck: No JVD, no carotid bruits, no thyromegaly, no lymphadenopathy.  Lungs:Clear bilaterally, no wheezes, rhonci, crackles Cardiovascular: Regular rate and rhythm. No murmurs, gallops or rubs. Abdomen:Soft. Bowel sounds present. Non-tender.  Extremities: No lower extremity edema. Pulses are 2 + in the bilateral DP/PT.  EKG:  EKG is not ordered today. The ekg from 11/30/20 is reviewed and shows sinus.   Recent Labs: 11/29/2020: ALT 15; TSH 1.84 11/30/2020: BUN 20;  Creatinine, Ser 0.69; Hemoglobin 12.8; Platelets 311; Potassium 3.9; Sodium 140   Lipid Panel    Component Value Date/Time   CHOL 196 11/29/2020 1500   TRIG 134.0 11/29/2020 1500   HDL 76.50 11/29/2020 1500   CHOLHDL 3 11/29/2020 1500   VLDL 26.8 11/29/2020 1500   LDLCALC 92 11/29/2020 1500     Wt Readings from Last 3 Encounters:  01/07/21 148 lb 12.8 oz (67.5 kg)  12/21/20 148 lb (67.1 kg)  11/29/20 146 lb 3.2 oz (66.3 kg)      Assessment and Plan:   1. Palpitations: Will arrange a 14 day Zio cardiac monitor and echo.   Current medicines are reviewed at length with the patient today.  The patient does not have concerns regarding medicines.  The following changes have been made:  no change  Labs/ tests ordered today include:   Orders Placed This Encounter  Procedures   LONG TERM MONITOR (3-14 DAYS)   ECHOCARDIOGRAM COMPLETE      Disposition:   FU with me in 6-8 weeks.     Signed, Lauree Chandler, MD 01/07/2021 9:28 AM    [facility] [place], [place], [place]  [address] Phone: [phone]; Fax: [phone]

## 2021-01-07 ENCOUNTER — Encounter: Payer: Self-pay | Admitting: Cardiovascular Disease

## 2021-01-07 ENCOUNTER — Other Ambulatory Visit: Payer: Self-pay

## 2021-01-07 ENCOUNTER — Ambulatory Visit: Payer: Medicare Other | Admitting: Cardiovascular Disease

## 2021-01-07 ENCOUNTER — Ambulatory Visit (INDEPENDENT_AMBULATORY_CARE_PROVIDER_SITE_OTHER): Payer: Medicare Other

## 2021-01-07 VITALS — BP 120/66 | HR 79 | Ht 61.25 in | Wt 148.8 lb

## 2021-01-07 DIAGNOSIS — R002 Palpitations: Secondary | ICD-10-CM

## 2021-01-07 NOTE — Progress Notes (Unsigned)
Enrolled patient for a 14 day Zio XT  monitor to be mailed to patients home  °

## 2021-01-07 NOTE — Patient Instructions (Signed)
Medication Instructions:  Your physician recommends that you continue on your current medications as directed. Please refer to the Current Medication list given to you today.  *If you need a refill on your cardiac medications before your next appointment, please call your pharmacy*   Lab Work: NONE If you have labs (blood work) drawn today and your tests are completely normal, you will receive your results only by: [place] (if you have MyChart) OR A paper copy in the mail If you have any lab test that is abnormal or we need to change your treatment, we will call you to review the results.   Testing/Procedures: Your physician has requested that you have an echocardiogram. Echocardiography is a painless test that uses sound waves to create images of your heart. It provides your doctor with information about the size and shape of your heart and how well your heart's chambers and valves are working. This procedure takes approximately one hour. There are no restrictions for this procedure.   Your physician has requested that you wear a 14 day heart monitor.     Follow-Up: At [facility], you and your health needs are our priority.  As part of our continuing mission to provide you with exceptional heart care, we have created designated Provider Care Teams.  These Care Teams include your primary Cardiologist (physician) and Advanced Practice Providers (APPs -  Physician Assistants and Nurse Practitioners) who all work together to provide you with the care you need, when you need it.    Your next appointment:   6 -8 week(s)  The format for your next appointment:   In Person  Provider:   You may see Dr. Angelena Form or one of the following Advanced Practice Providers on your designated Care Team:   Melina Copa, PA-C Ermalinda Barrios, PA-C   Other Instructions [place] Monitor Instructions  Your physician has requested you wear a ZIO patch monitor for 14 days.  This is a  single patch monitor. [employer] supplies one patch monitor per enrollment. Additional stickers are not available. Please do not apply patch if you will be having a Nuclear Stress Test,  Echocardiogram, Cardiac CT, MRI, or Chest Xray during the period you would be wearing the  monitor. The patch cannot be worn during these tests. You cannot remove and re-apply the  ZIO XT patch monitor.  Your ZIO patch monitor will be mailed 3 day USPS to your address on file. It may take 3-5 days  to receive your monitor after you have been enrolled.  Once you have received your monitor, please review the enclosed instructions. Your monitor  has already been registered assigning a specific monitor serial # to you.  Billing and Patient Assistance Program Information  We have supplied [employer] with any of your insurance information on file for billing purposes. [employer] offers a sliding scale Patient Assistance Program for patients that do not have  insurance, or whose insurance does not completely cover the cost of the ZIO monitor.  You must apply for the Patient Assistance Program to qualify for this discounted rate.  To apply, please call [employer] at [phone], select option 4, select option 2, ask to apply for  Patient Assistance Program. Theodore Demark will ask your household income, and how many people  are in your household. They will quote your out-of-pocket cost based on that information.  [employer] will also be able to set up a 5-month interest-free payment plan if needed.  Applying the monitor  Shave hair from upper left chest.  Hold abrader disc by orange tab. Rub abrader in 40 strokes over the upper left chest as  indicated in your monitor instructions.  Clean area with 4 enclosed alcohol pads. Let dry.  Apply patch as indicated in monitor instructions. Patch will be placed under collarbone on left  side of chest with arrow pointing upward.  Rub patch adhesive wings for 2 minutes. Remove white label  marked "1". Remove the white  label marked "2". Rub patch adhesive wings for 2 additional minutes.  While looking in a mirror, press and release button in center of patch. A small green light will  flash 3-4 times. This will be your only indicator that the monitor has been turned on.  Do not shower for the first 24 hours. You may shower after the first 24 hours.  Press the button if you feel a symptom. You will hear a small click. Record Date, Time and  Symptom in the Patient Logbook.  When you are ready to remove the patch, follow instructions on the last 2 pages of Patient  Logbook. Stick patch monitor onto the last page of Patient Logbook.  Place Patient Logbook in the blue and white box. Use locking tab on box and tape box closed  securely. The blue and white box has prepaid postage on it. Please place it in the mailbox as  soon as possible. Your physician should have your test results approximately 7 days after the  monitor has been mailed back to [facility].  Call [place] at [phone] if you have questions regarding  your ZIO XT patch monitor. Call them immediately if you see an orange light blinking on your  monitor.  If your monitor falls off in less than 4 days, contact our Monitor department at [phone].  If your monitor becomes loose or falls off after 4 days call [employer] at [phone] for  suggestions on securing your monitor

## 2021-01-10 ENCOUNTER — Ambulatory Visit (INDEPENDENT_AMBULATORY_CARE_PROVIDER_SITE_OTHER): Payer: Medicare Other | Admitting: *Deleted

## 2021-01-10 DIAGNOSIS — Z0000 Encounter for general adult medical examination without abnormal findings: Secondary | ICD-10-CM

## 2021-01-10 NOTE — Progress Notes (Signed)
Subjective:   Katherine Blake is a 75 y.o. female who presents for Medicare Annual (Subsequent) preventive examination.  I connected with  Katherine Blake on 01/10/21 by a telephone enabled telemedicine application and verified that I am speaking with the correct person using two identifiers.   I discussed the limitations of evaluation and management by telemedicine. The patient expressed understanding and agreed to proceed.   Review of Systems   NA Cardiac Risk Factors include: advanced age (>46mn, >>75women)     Objective:    Today's Vitals   There is no height or weight on file to calculate BMI.  Advanced Directives 01/10/2021 01/06/2020 09/11/2019 11/13/2018 08/30/2017  Does Patient Have a Medical Advance Directive? Yes Yes No Yes Yes  Type of AParamedicof AOdumLiving will [place]stellineLiving will - H[place]ryLiving will [place]zanLiving will  Copy of [place] Chart? Yes - validated most recent copy scanned in chart (See row information) No - copy requested - Yes - validated most recent copy scanned in chart (See row information) No - copy requested  Would patient like information on creating a medical advance directive? - - No - Patient declined - -    Current Medications (verified) Outpatient Encounter Medications as of 01/10/2021  Medication Sig   Calcium Carbonate-Vitamin D (CALCIUM + D PO) Take 1 tablet by mouth daily.    Cardiac Defibrillators KIT Only to use in emergencies- unresponsiveness or cardiac distress   fluticasone (FLONASE) 50 MCG/ACT nasal spray Place 1 spray into both nostrils every evening.    NONFORMULARY OR COMPOUNDED ITEM Estradiol vag cream 0.02% ##09prefilled applicators Insert 1 gram twice weekly.   Red Yeast Rice Extract (RED YEAST RICE PO) Take by mouth.   No facility-administered encounter medications on file as of 01/10/2021.    Allergies  (verified) Amoxicillin and Cephalexin   History: Past Medical History:  Diagnosis Date   Allergy    History of chicken pox    Past Surgical History:  Procedure Laterality Date   ABDOMINAL HYSTERECTOMY  1987   TAH,BSO Endometriosis   CESAREAN SECTION     X 2   OOPHORECTOMY     BSO   PELVIC LAPAROSCOPY  1980   Family History  Problem Relation Age of Onset   Dementia Mother    Thrombocytopenia Mother    Other Mother        JAK2 positive   Heart failure Father    Cancer Maternal Grandmother        Leukemia   Social History   Socioeconomic History   Marital status: Married    Spouse name: Not on file   Number of children: 2   Years of education: Not on file   Highest education level: Not on file  Occupational History   Occupation: REtired   Occupation: TActor Tobacco Use   Smoking status: Never   Smokeless tobacco: Never  Vaping Use   Vaping Use: Never used  Substance and Sexual Activity   Alcohol use: Not Currently    Alcohol/week: 0.0 standard drinks   Drug use: Never   Sexual activity: Yes    Birth control/protection: Surgical  Other Topics Concern   Not on file  Social History Narrative   Not on file   Social Determinants of Health   Financial Resource Strain: Low Risk    Difficulty of Paying Living Expenses: Not hard at all  Food  Insecurity: No Food Insecurity   Worried About Charity fundraiser in the Last Year: Never true   Ran Out of Food in the Last Year: Never true  Transportation Needs: No Transportation Needs   Lack of Transportation (Medical): No   Lack of Transportation (Non-Medical): No  Physical Activity: Insufficiently Active   Days of Exercise per Week: 3 days   Minutes of Exercise per Session: 40 min  Stress: No Stress Concern Present   Feeling of Stress : Not at all  Social Connections: Socially Integrated   Frequency of Communication with Friends and Family: More than three times a week   Frequency of Social  Gatherings with Friends and Family: More than three times a week   Attends Religious Services: More than 4 times per year   Active Member of [employer] or Organizations: Yes   Attends Music therapist: More than 4 times per year   Marital Status: Married    Tobacco Counseling Counseling given: Not Answered   Clinical Intake:  Pre-visit preparation completed: Yes  Pain : No/denies pain     Nutritional Risks: None Diabetes: No  How often do you need to have someone help you when you read instructions, pamphlets, or other written materials from your doctor or pharmacy?: 1 - Never  Diabetic?  no  Interpreter Needed?: No  Information entered by :: Leroy Kennedy LPN   Activities of Daily Living In your present state of health, do you have any difficulty performing the following activities: 01/10/2021 11/29/2020  Hearing? N N  Vision? N N  Difficulty concentrating or making decisions? N N  Walking or climbing stairs? N N  Dressing or bathing? N N  Doing errands, shopping? N N  Preparing Food and eating ? N -  Using the Toilet? N -  In the past six months, have you accidently leaked urine? N -  Do you have problems with loss of bowel control? N -  Managing your Medications? N -  Managing your Finances? N -  Housekeeping or managing your Housekeeping? N -  Some recent data might be hidden    Patient Care Team: Midge Minium, MD as PCP - General (Family Medicine) Harold Hedge, Darrick Grinder, MD as Consulting Physician (Allergy and Immunology) Phineas Real Belinda Block, MD (Inactive) as Consulting Physician (Gynecology) Juanita Craver, MD as Consulting Physician (Gastroenterology) [name], MD as Consulting Physician (Dermatology) Pedro Earls, MD as Attending Physician (Family Medicine) Mariea Clonts (Dentistry)  Indicate any recent Medical Services you may have received from other than [facility] providers in the past year (date may be approximate).     Assessment:   This  is a routine wellness examination for [place].  Hearing/Vision screen Hearing Screening - Comments:: No trouble hearing  Vision Screening - Comments:: Up to Date Dr. Delman Cheadle  Dietary issues and exercise activities discussed: Current Exercise Habits: Home exercise routine, Time (Minutes): 40, Frequency (Times/Week): 5, Weekly Exercise (Minutes/Week): 200, Intensity: Moderate   Goals Addressed             This Visit's Progress    Patient Stated       Loose 10lb pounds       Depression Screen PHQ 2/9 Scores 01/10/2021 11/29/2020 05/24/2020 01/06/2020 08/27/2019 05/21/2019 11/13/2018  PHQ - 2 Score 0 0 0 0 0 0 0  PHQ- 9 Score - 0 0 - - 0 -    Fall Risk Fall Risk  01/10/2021 11/29/2020 05/24/2020 01/06/2020 08/27/2019  Falls in the past  year? 0 0 1 1 0  Comment - - - - -  Number falls in past yr: 0 0 0 0 0  Injury with Fall? 0 0 0 0 0  Risk for fall due to : - No Fall Risks No Fall Risks - -  Follow up Falls evaluation completed;Falls prevention discussed - - Falls prevention discussed Falls evaluation completed    FALL RISK PREVENTION PERTAINING TO THE HOME:  Any stairs in or around the home? Yes  If so, are there any without handrails? Yes  Home free of loose throw rugs in walkways, pet beds, electrical cords, etc? Yes  Adequate lighting in your home to reduce risk of falls? Yes   ASSISTIVE DEVICES UTILIZED TO PREVENT FALLS:  Life alert? No  Use of a cane, walker or w/c? No  Grab bars in the bathroom? No  Shower chair or bench in shower? No  Elevated toilet seat or a handicapped toilet? No   TIMED UP AND GO:  Was the test performed? No .   Cognitive Function: MMSE - Mini Mental State Exam 11/13/2018  Orientation to time 5  Orientation to Place 5  Registration 3  Attention/ Calculation 5  Recall 1  Language- name 2 objects 2  Language- repeat 1  Language- follow 3 step command 3  Language- read & follow direction 1  Write a sentence 1  Copy design 1  Total score 28      6CIT Screen 01/06/2020  What Year? 0 points  What month? 0 points  What time? 0 points  Count back from 20 0 points  Months in reverse 0 points  Repeat phrase 0 points  Total Score 0    Immunizations Immunization History  Administered Date(s) Administered   Fluad Quad(high Dose 65+) 02/17/2020   Influenza, High Dose Seasonal PF 02/12/2015, 03/02/2019   Influenza,inj,Quad PF,6+ Mos 03/13/2016, 02/22/2017, 02/01/2018   PFIZER SARS-COV-2 Pediatric Vaccination 5-49yr 09/21/2020   PFIZER(Purple Top)SARS-COV-2 Vaccination 07/03/2019, 07/22/2019, 03/15/2020   Pneumococcal Conjugate-13 12/04/2013   Pneumococcal Polysaccharide-23 08/30/2017   Tdap 12/25/2017   Zoster Recombinat (Shingrix) 10/18/2010   Zoster, Live 10/18/2010    TDAP status: Up to date  Flu Vaccine status: Up to date  Pneumococcal vaccine status: Up to date  Covid-19 vaccine status: Completed vaccines  Qualifies for Shingles Vaccine? Yes   Zostavax completed Yes   Shingrix Completed?: No.    Education has been provided regarding the importance of this vaccine. Patient has been advised to call insurance company to determine out of pocket expense if they have not yet received this vaccine. Advised may also receive vaccine at local pharmacy or Health Dept. Verbalized acceptance and understanding.  Screening Tests Health Maintenance  Topic Date Due   Zoster Vaccines- Shingrix (2 of 2) 12/13/2010   COVID-19 Vaccine (4 - Booster for Pfizer series) 12/21/2020   INFLUENZA VACCINE  01/10/2021   Hepatitis C Screening  11/29/2021 (Originally 09/07/1963)   Fecal DNA (Cologuard)  08/03/2022   MAMMOGRAM  12/21/2022   TETANUS/TDAP  12/26/2027   DEXA SCAN  Completed   PNA vac Low Risk Adult  Completed   HPV VACCINES  Aged Out    Health Maintenance  Health Maintenance Due  Topic Date Due   Zoster Vaccines- Shingrix (2 of 2) 12/13/2010   COVID-19 Vaccine (4 - Booster for Pfizer series) 12/21/2020   INFLUENZA  VACCINE  01/10/2021    Colorectal cancer screening: Type of screening: Cologuard. Completed  . Repeat every 3 years  Mammogram status: Completed  . Repeat every year  Bone Density status: Completed  . Results reflect: Bone density results: NORMAL. Repeat every 3 years.  Lung Cancer Screening: (Low Dose CT Chest recommended if Age 72-80 years, 30 pack-year currently smoking OR have quit w/in 15years.) does not qualify.   Lung Cancer Screening Referral: na  Additional Screening:  Hepatitis C Screening: does not qualify; Completed   Vision Screening: Recommended annual ophthalmology exams for early detection of glaucoma and other disorders of the eye. Is the patient up to date with their annual eye exam?  Yes  Who is the provider or what is the name of the office in which the patient attends annual eye exams? Dr. Delman Cheadle If pt is not established with a provider, would they like to be referred to a provider to establish care? No .   Dental Screening: Recommended annual dental exams for proper oral hygiene  Community Resource Referral / Chronic Care Management: CRR required this visit?  No   CCM required this visit?  No      Plan:     I have personally reviewed and noted the following in the patient's chart:   Medical and social history Use of alcohol, tobacco or illicit drugs  Current medications and supplements including opioid prescriptions.  Functional ability and status Nutritional status Physical activity Advanced directives List of other physicians Hospitalizations, surgeries, and ER visits in previous 12 months Vitals Screenings to include cognitive, depression, and falls Referrals and appointments  In addition, I have reviewed and discussed with patient certain preventive protocols, quality metrics, and best practice recommendations. A written personalized care plan for preventive services as well as general preventive health recommendations were provided to  patient.     Leroy Kennedy, LPN   08/16/[id number]   Nurse Notes: na

## 2021-01-10 NOTE — Patient Instructions (Signed)
Katherine Blake , Thank you for taking time to come for your Medicare Wellness Visit. I appreciate your ongoing commitment to your health goals. Please review the following plan we discussed and let me know if I can assist you in the future.   Screening recommendations/referrals: Colonoscopy: up to date Mammogram: up to date Bone Density: up to date Recommended yearly ophthalmology/optometry visit for glaucoma screening and checkup Recommended yearly dental visit for hygiene and checkup  Vaccinations: Influenza vaccine: up to date Pneumococcal vaccine: up to date Tdap vaccine: up to date Shingles vaccine: Education provided    Advanced directives: On file  Conditions/risks identified: NA     Preventive Care 73 Years and Older, Female Preventive care refers to lifestyle choices and visits with your health care provider that can promote health and wellness. What does preventive care include? A yearly physical exam. This is also called an annual well check. Dental exams once or twice a year. Routine eye exams. Ask your health care provider how often you should have your eyes checked. Personal lifestyle choices, including: Daily care of your teeth and gums. Regular physical activity. Eating a healthy diet. Avoiding tobacco and drug use. Limiting alcohol use. Practicing safe sex. Taking low-dose aspirin every day. Taking vitamin and mineral supplements as recommended by your health care provider. What happens during an annual well check? The services and screenings done by your health care provider during your annual well check will depend on your age, overall health, lifestyle risk factors, and family history of disease. Counseling  Your health care provider may ask you questions about your: Alcohol use. Tobacco use. Drug use. Emotional well-being. Home and relationship well-being. Sexual activity. Eating habits. History of falls. Memory and ability to understand  (cognition). Work and work Statistician. Reproductive health. Screening  You may have the following tests or measurements: Height, weight, and BMI. Blood pressure. Lipid and cholesterol levels. These may be checked every 5 years, or more frequently if you are over 73 years old. Skin check. Lung cancer screening. You may have this screening every year starting at age 20 if you have a 30-pack-year history of smoking and currently smoke or have quit within the past 15 years. Fecal occult blood test (FOBT) of the stool. You may have this test every year starting at age 4. Flexible sigmoidoscopy or colonoscopy. You may have a sigmoidoscopy every 5 years or a colonoscopy every 10 years starting at age 31. Hepatitis C blood test. Hepatitis B blood test. Sexually transmitted disease (STD) testing. Diabetes screening. This is done by checking your blood sugar (glucose) after you have not eaten for a while (fasting). You may have this done every 1-3 years. Bone density scan. This is done to screen for osteoporosis. You may have this done starting at age 20. Mammogram. This may be done every 1-2 years. Talk to your health care provider about how often you should have regular mammograms. Talk with your health care provider about your test results, treatment options, and if necessary, the need for more tests. Vaccines  Your health care provider may recommend certain vaccines, such as: Influenza vaccine. This is recommended every year. Tetanus, diphtheria, and acellular pertussis (Tdap, Td) vaccine. You may need a Td booster every 10 years. Zoster vaccine. You may need this after age 56. Pneumococcal 13-valent conjugate (PCV13) vaccine. One dose is recommended after age 64. Pneumococcal polysaccharide (PPSV23) vaccine. One dose is recommended after age 75. Talk to your health care provider about which screenings and vaccines  you need and how often you need them. This information is not intended to  replace advice given to you by your health care provider. Make sure you discuss any questions you have with your health care provider. Document Released: 06/25/2015 Document Revised: 02/16/2016 Document Reviewed: 03/30/2015 Elsevier Interactive Patient Education  2017 [employer] Prevention in the Home Falls can cause injuries. They can happen to people of all ages. There are many things you can do to make your home safe and to help prevent falls. What can I do on the outside of my home? Regularly fix the edges of walkways and driveways and fix any cracks. Remove anything that might make you trip as you walk through a door, such as a raised step or threshold. Trim any bushes or trees on the path to your home. Use bright outdoor lighting. Clear any walking paths of anything that might make someone trip, such as rocks or tools. Regularly check to see if handrails are loose or broken. Make sure that both sides of any steps have handrails. Any raised decks and porches should have guardrails on the edges. Have any leaves, snow, or ice cleared regularly. Use sand or salt on walking paths during winter. Clean up any spills in your garage right away. This includes oil or grease spills. What can I do in the bathroom? Use night lights. Install grab bars by the toilet and in the tub and shower. Do not use towel bars as grab bars. Use non-skid mats or decals in the tub or shower. If you need to sit down in the shower, use a plastic, non-slip stool. Keep the floor dry. Clean up any water that spills on the floor as soon as it happens. Remove soap buildup in the tub or shower regularly. Attach bath mats securely with double-sided non-slip rug tape. Do not have throw rugs and other things on the floor that can make you trip. What can I do in the bedroom? Use night lights. Make sure that you have a light by your bed that is easy to reach. Do not use any sheets or blankets that are too big for  your bed. They should not hang down onto the floor. Have a firm chair that has side arms. You can use this for support while you get dressed. Do not have throw rugs and other things on the floor that can make you trip. What can I do in the kitchen? Clean up any spills right away. Avoid walking on wet floors. Keep items that you use a lot in easy-to-reach places. If you need to reach something above you, use a strong step stool that has a grab bar. Keep electrical cords out of the way. Do not use floor polish or wax that makes floors slippery. If you must use wax, use non-skid floor wax. Do not have throw rugs and other things on the floor that can make you trip. What can I do with my stairs? Do not leave any items on the stairs. Make sure that there are handrails on both sides of the stairs and use them. Fix handrails that are broken or loose. Make sure that handrails are as long as the stairways. Check any carpeting to make sure that it is firmly attached to the stairs. Fix any carpet that is loose or worn. Avoid having throw rugs at the top or bottom of the stairs. If you do have throw rugs, attach them to the floor with carpet tape. Make sure  that you have a light switch at the top of the stairs and the bottom of the stairs. If you do not have them, ask someone to add them for you. What else can I do to help prevent falls? Wear shoes that: Do not have high heels. Have rubber bottoms. Are comfortable and fit you well. Are closed at the toe. Do not wear sandals. If you use a stepladder: Make sure that it is fully opened. Do not climb a closed stepladder. Make sure that both sides of the stepladder are locked into place. Ask someone to hold it for you, if possible. Clearly mark and make sure that you can see: Any grab bars or handrails. First and last steps. Where the edge of each step is. Use tools that help you move around (mobility aids) if they are needed. These  include: Canes. Walkers. Scooters. Crutches. Turn on the lights when you go into a dark area. Replace any light bulbs as soon as they burn out. Set up your furniture so you have a clear path. Avoid moving your furniture around. If any of your floors are uneven, fix them. If there are any pets around you, be aware of where they are. Review your medicines with your doctor. Some medicines can make you feel dizzy. This can increase your chance of falling. Ask your doctor what other things that you can do to help prevent falls. This information is not intended to replace advice given to you by your health care provider. Make sure you discuss any questions you have with your health care provider. Document Released: 03/25/2009 Document Revised: 11/04/2015 Document Reviewed: 07/03/2014 Elsevier Interactive Patient Education  2017 [employer].

## 2021-01-11 ENCOUNTER — Ambulatory Visit (HOSPITAL_COMMUNITY): Payer: Medicare Other | Attending: Cardiology

## 2021-01-11 ENCOUNTER — Other Ambulatory Visit: Payer: Self-pay

## 2021-01-11 DIAGNOSIS — E785 Hyperlipidemia, unspecified: Secondary | ICD-10-CM | POA: Diagnosis not present

## 2021-01-11 DIAGNOSIS — I358 Other nonrheumatic aortic valve disorders: Secondary | ICD-10-CM | POA: Insufficient documentation

## 2021-01-11 DIAGNOSIS — R002 Palpitations: Secondary | ICD-10-CM

## 2021-01-11 LAB — ECHOCARDIOGRAM COMPLETE
Area-P 1/2: 3.68 cm2
S' Lateral: 2.9 cm

## 2021-01-28 ENCOUNTER — Telehealth: Payer: Self-pay

## 2021-01-28 DIAGNOSIS — I471 Supraventricular tachycardia: Secondary | ICD-10-CM

## 2021-01-28 DIAGNOSIS — R002 Palpitations: Secondary | ICD-10-CM | POA: Diagnosis not present

## 2021-01-28 DIAGNOSIS — H5213 Myopia, bilateral: Secondary | ICD-10-CM | POA: Diagnosis not present

## 2021-01-28 MED ORDER — METOPROLOL SUCCINATE ER 25 MG PO TB24
25.0000 mg | ORAL_TABLET | Freq: Every day | ORAL | 3 refills | Status: DC | PRN
Start: 2021-01-28 — End: 2021-05-02

## 2021-01-28 NOTE — Telephone Encounter (Signed)
The patient has been notified of the result and verbalized understanding.  All questions (if any) were answered. Antonieta Iba, RN 01/28/2021 4:28 PM  Rx has been sent in for PRN toprol XL. Referral has been placed for EP. Patient has been educated on vagal maneuvers.  Patient would like to know if she should keep her follow up on 9/14 with Dr. Angelena Form

## 2021-01-28 NOTE — Telephone Encounter (Signed)
-----   Message from Katherine Blanks, MD sent at 01/28/2021  4:01 PM EDT ----- Monitor with SVT. She will need EP referral. One episode over 11 minutes. Resting heart rate can be into the 50s. Can we call in Toprol 25 mg for her to use when she feels her heart racing and also review vagal maneuvers to break SVT while awaiting EP appt? Thanks, chris

## 2021-01-31 NOTE — Telephone Encounter (Signed)
Patient is scheduled to see Dr. Lovena Le on 8/25. Appointment with Dr. Angelena Form has been cancelled.

## 2021-02-03 ENCOUNTER — Ambulatory Visit: Payer: Medicare Other | Admitting: Internal Medicine

## 2021-02-03 ENCOUNTER — Other Ambulatory Visit: Payer: Self-pay

## 2021-02-03 VITALS — BP 132/66 | HR 72 | Ht 60.0 in | Wt 148.8 lb

## 2021-02-03 DIAGNOSIS — I471 Supraventricular tachycardia: Secondary | ICD-10-CM | POA: Diagnosis not present

## 2021-02-03 DIAGNOSIS — R002 Palpitations: Secondary | ICD-10-CM | POA: Diagnosis not present

## 2021-02-03 NOTE — Patient Instructions (Signed)
Medication Instructions:  Your physician recommends that you continue on your current medications as directed. Please refer to the Current Medication list given to you today.  *If you need a refill on your cardiac medications before your next appointment, please call your pharmacy*   Lab Work: None ordered.  If you have labs (blood work) drawn today and your tests are completely normal, you will receive your results only by: [place] (if you have MyChart) OR A paper copy in the mail If you have any lab test that is abnormal or we need to change your treatment, we will call you to review the results.   Testing/Procedures: None ordered.    Follow-Up: At [facility], you and your health needs are our priority.  As part of our continuing mission to provide you with exceptional heart care, we have created designated Provider Care Teams.  These Care Teams include your primary Cardiologist (physician) and Advanced Practice Providers (APPs -  Physician Assistants and Nurse Practitioners) who all work together to provide you with the care you need, when you need it.  We recommend signing up for the patient portal called "MyChart".  Sign up information is provided on this After Visit Summary.  MyChart is used to connect with patients for Virtual Visits (Telemedicine).  Patients are able to view lab/test results, encounter notes, upcoming appointments, etc.  Non-urgent messages can be sent to your provider as well.   To learn more about what you can do with MyChart, go to [email or web address].    Your next appointment:   Follow up as needed with Dr Lovena Le

## 2021-02-03 NOTE — Progress Notes (Signed)
HPI Katherine Blake is referred by Dr. Elder Love for evaluation of SVT. She is a healthy 75 yo woman with a h/o palpitations dating back about 2 months. She had received a script for metoprolol but has not had to take the medication. She has never had syncope. She wore a cardiac monitor which demonstrated bouts of SVT at 160/min. These start with a PAC and PR prolongation. She notes that the episodes start and stop suddenly.  Allergies  Allergen Reactions   Amoxicillin Diarrhea   Cephalexin Rash     Current Outpatient Medications  Medication Sig Dispense Refill   Calcium Carbonate-Vitamin D (CALCIUM + D PO) Take 1 tablet by mouth daily.      Cardiac Defibrillators KIT Only to use in emergencies- unresponsiveness or cardiac distress 1 kit 0   fluticasone (FLONASE) 50 MCG/ACT nasal spray Place 1 spray into both nostrils every evening.      metoprolol succinate (TOPROL XL) 25 MG 24 hr tablet Take 1 tablet (25 mg total) by mouth daily as needed. 45 tablet 3   NONFORMULARY OR COMPOUNDED ITEM Estradiol vag cream 0.02% #00 prefilled applicators Insert 1 gram twice weekly. 90 each 3   Red Yeast Rice Extract (RED YEAST RICE PO) Take by mouth.     No current facility-administered medications for this visit.     Past Medical History:  Diagnosis Date   Allergy    History of chicken pox     ROS:   All systems reviewed and negative except as noted in the HPI.   Past Surgical History:  Procedure Laterality Date   ABDOMINAL HYSTERECTOMY  1987   TAH,BSO Endometriosis   CESAREAN SECTION     X 2   OOPHORECTOMY     BSO   PELVIC LAPAROSCOPY  1980     Family History  Problem Relation Age of Onset   Dementia Mother    Thrombocytopenia Mother    Other Mother        JAK2 positive   Heart failure Father    Cancer Maternal Grandmother        Leukemia     Social History   Socioeconomic History   Marital status: Married    Spouse name: Not on file   Number of children: 2   Years  of education: Not on file   Highest education level: Not on file  Occupational History   Occupation: REtired   Occupation: Actor  Tobacco Use   Smoking status: Never   Smokeless tobacco: Never  Scientific laboratory technician Use: Never used  Substance and Sexual Activity   Alcohol use: Not Currently    Alcohol/week: 0.0 standard drinks   Drug use: Never   Sexual activity: Yes    Birth control/protection: Surgical  Other Topics Concern   Not on file  Social History Narrative   Not on file   Social Determinants of Health   Financial Resource Strain: Low Risk    Difficulty of Paying Living Expenses: Not hard at all  Food Insecurity: No Food Insecurity   Worried About Charity fundraiser in the Last Year: Never true   [place] in the Last Year: Never true  Transportation Needs: No Transportation Needs   Lack of Transportation (Medical): No   Lack of Transportation (Non-Medical): No  Physical Activity: Insufficiently Active   Days of Exercise per Week: 3 days   Minutes of Exercise per Session: 40 min  Stress: No Stress  Concern Present   Feeling of Stress : Not at all  Social Connections: Socially Integrated   Frequency of Communication with Friends and Family: More than three times a week   Frequency of Social Gatherings with Friends and Family: More than three times a week   Attends Religious Services: More than 4 times per year   Active Member of [employer] or Organizations: Yes   Attends Music therapist: More than 4 times per year   Marital Status: Married  Human resources officer Violence: Not At Risk   Fear of Current or Ex-Partner: No   Emotionally Abused: No   Physically Abused: No   Sexually Abused: No     BP 132/66   Pulse 72   Ht 5' (1.524 m)   Wt 148 lb 12.8 oz (67.5 kg)   SpO2 97%   BMI 29.06 kg/m   Physical Exam:  Well appearing NAD HEENT: Unremarkable Neck:  No JVD, no thyromegally Lymphatics:  No adenopathy Back:  No CVA  tenderness Lungs:  Clear with no wheezes HEART:  Regular rate rhythm, no murmurs, no rubs, no clicks Abd:  soft, positive bowel sounds, no organomegally, no rebound, no guarding Ext:  2 plus pulses, no edema, no cyanosis, no clubbing Skin:  No rashes no nodules Neuro:  CN II through XII intact, motor grossly intact  Assess/Plan:  SVT - I have discussed the treatment options with the patient. As she is minimally symptomatic, I have recommended she avoid caffeine and ETOH and adequate rest. Her episodes are currently not bothersome enough to recommend catheter ablation. We we discussed medical therapy. She will start her beta blocker if her symptoms worsen but currently she declines as she is not too symptomatic. Carleene Overlie Dyamon Sosinski,MD

## 2021-02-15 ENCOUNTER — Encounter: Payer: Self-pay | Admitting: Family Medicine

## 2021-02-23 ENCOUNTER — Ambulatory Visit: Payer: Medicare Other | Admitting: Cardiovascular Disease

## 2021-03-10 ENCOUNTER — Other Ambulatory Visit: Payer: Self-pay

## 2021-03-10 NOTE — Progress Notes (Signed)
Pharmacy sent request for Compound estradiol cream. Tiffany prescribed it 12/21/2020 for the year. I called [facility] and let them know.

## 2021-04-26 ENCOUNTER — Ambulatory Visit: Payer: Medicare Other | Admitting: Dermatology

## 2021-05-02 ENCOUNTER — Encounter: Payer: Self-pay | Admitting: Cardiovascular Disease

## 2021-05-02 MED ORDER — METOPROLOL SUCCINATE ER 25 MG PO TB24
12.5000 mg | ORAL_TABLET | Freq: Every day | ORAL | 3 refills | Status: DC
Start: 2021-05-02 — End: 2022-07-11

## 2021-05-11 ENCOUNTER — Other Ambulatory Visit: Payer: Self-pay

## 2021-05-11 ENCOUNTER — Encounter: Payer: Self-pay | Admitting: Dermatology

## 2021-05-11 ENCOUNTER — Ambulatory Visit: Payer: Medicare Other | Admitting: Dermatology

## 2021-05-11 DIAGNOSIS — Z1283 Encounter for screening for malignant neoplasm of skin: Secondary | ICD-10-CM | POA: Diagnosis not present

## 2021-05-11 DIAGNOSIS — L570 Actinic keratosis: Secondary | ICD-10-CM

## 2021-05-11 DIAGNOSIS — Z85828 Personal history of other malignant neoplasm of skin: Secondary | ICD-10-CM

## 2021-05-11 MED ORDER — IMIQUIMOD 5 % EX CREA: TOPICAL_CREAM | Freq: Every day | CUTANEOUS | 0 refills | Status: DC

## 2021-05-29 ENCOUNTER — Encounter: Payer: Self-pay | Admitting: Dermatology

## 2021-05-29 NOTE — Progress Notes (Signed)
° °  Follow-Up Visit   Subjective  Katherine Blake is a 75 y.o. female who presents for the following: Annual Exam (More scale in the scalp patient has history of scc in the scalp).  New crusted areas on arms, face, scalp.  Check other spots. Location:  Duration:  Quality:  Associated Signs/Symptoms: Modifying Factors:  Severity:  Timing: Context:   Objective  Well appearing patient in no apparent distress; mood and affect are within normal limits. All sun exposed areas plus back abdomen upper chest examined.  No atypical pigmented lesions.  Left Arm (3), Right Arm (2), facial/nose Pink gritty crust which may be a mixture of irritated benign keratoses plus ultraviolet damage.  We will freeze the thicker spots and try Aldara on any residual.  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  There also multiple inflamed keratoses that have been biopsied.    All sun exposed areas plus back examined.   Assessment & Plan    AK (actinic keratosis) (6) facial/nose; Left Arm (3); Right Arm (2)  Full body skin check   Destruction of lesion - Left Arm, Right Arm, facial/nose Complexity: simple   Destruction method: cryotherapy   Informed consent: discussed and consent obtained   Timeout:  patient name, date of birth, surgical site, and procedure verified Lesion destroyed using liquid nitrogen: Yes   Cryotherapy cycles:  3 Outcome: patient tolerated procedure well with no complications   Post-procedure details: wound care instructions given    imiquimod (ALDARA) 5 % cream - facial/nose Apply topically at bedtime. Apply to scalp Mon-Friday 24 total applications  Personal history of skin cancer Left Parietal Scalp  No sign recurrent skin cancer clinically a mixture of irritated keratoses and actinic keratoses will try Aldara.  Encounter for screening for malignant neoplasm of skin  Annual skin  examination.      I, Katherine Monarch, MD, have reviewed all documentation for this visit.  The documentation on 05/29/21 for the exam, diagnosis, procedures, and orders are all accurate and complete.

## 2021-07-07 ENCOUNTER — Telehealth: Payer: Self-pay | Admitting: Dermatology

## 2021-07-07 NOTE — Telephone Encounter (Signed)
Patient left message on office voice mail saying that she has been doing a scalp treatment (office notes say Imiquimod) and is half way through the treatment plan.  Patient wants to confirm that she does need to continue and finish the treatment process.  Patient states that the area is black and crusty and broadening, but it is not painful.  Does she need to continue?

## 2021-07-08 ENCOUNTER — Encounter: Payer: Self-pay | Admitting: Dermatology

## 2021-08-21 ENCOUNTER — Encounter: Payer: Self-pay | Admitting: Family Medicine

## 2021-08-22 ENCOUNTER — Encounter: Payer: Self-pay | Admitting: Family Medicine

## 2021-08-22 ENCOUNTER — Ambulatory Visit (INDEPENDENT_AMBULATORY_CARE_PROVIDER_SITE_OTHER): Payer: Medicare Other | Admitting: Family Medicine

## 2021-08-22 VITALS — BP 100/68 | HR 95 | Temp 97.3°F | Resp 16 | Wt 148.8 lb

## 2021-08-22 DIAGNOSIS — J069 Acute upper respiratory infection, unspecified: Secondary | ICD-10-CM | POA: Diagnosis not present

## 2021-08-22 MED ORDER — CETIRIZINE HCL 10 MG PO TABS: 10.0000 mg | ORAL_TABLET | Freq: Every day | ORAL | 11 refills | Status: AC

## 2021-08-22 MED ORDER — ALBUTEROL SULFATE HFA 108 (90 BASE) MCG/ACT IN AERS: 2.0000 | INHALATION_SPRAY | Freq: Four times a day (QID) | RESPIRATORY_TRACT | 0 refills | Status: DC | PRN

## 2021-08-22 MED ORDER — GUAIFENESIN-CODEINE 100-10 MG/5ML PO SYRP: 10.0000 mL | ORAL_SOLUTION | Freq: Three times a day (TID) | ORAL | 0 refills | Status: DC | PRN

## 2021-08-22 MED ORDER — BENZONATATE 200 MG PO CAPS: 200.0000 mg | ORAL_CAPSULE | Freq: Three times a day (TID) | ORAL | 0 refills | Status: DC | PRN

## 2021-08-22 NOTE — Patient Instructions (Signed)
Schedule your complete physical in 3-4 months at your convenience ?START the Cetirizine (Zyrtec) daily ?CONTINUE the Flonase- 2 sprays each nostril ?USE the albuterol inhaler for cough/wheeze/shortness of breath ?TAKE the codeine cough syrup as needed- may cause drowsiness ?TAKE the Robitussin cough syrup when you need to be more mentally sharp ?ADD the cough pills to either syrup to help improve cough symptoms ?Drink LOTS of fluids ?REST!!! ?Call with any questions or concerns ?Hang in there!!! ?

## 2021-08-22 NOTE — Progress Notes (Signed)
? ?  Subjective:  ? ? Patient ID: Katherine Blake, female    DOB: [birth year], 76 y.o.   MRN: [id number] ? ?HPI ?URI- sxs started 1 week ago w/ sore throat.  Then developed cough and congestion.  Has been using Netti pot, Flonase, Benadryl.  Over the weekend developed severe HA.  + facial pain.  + tooth pain.  Was using ice pack last night across eyes due to pain.  Took OTC Sudafed Sinus and Robitussin last night.  Muscles are sore from intense coughing.  Cough is productive.  Nasal congestion remains clear.  No known sick contacts. ? ? ?Review of Systems ?For ROS see HPI  ? ?This visit occurred during the SARS-CoV-2 public health emergency.  Safety protocols were in place, including screening questions prior to the visit, additional usage of staff PPE, and extensive cleaning of exam room while observing appropriate contact time as indicated for disinfecting solutions.   ?   ?Objective:  ? Physical Exam ?Vitals reviewed.  ?Constitutional:   ?   General: She is not in acute distress. ?   Appearance: Normal appearance. She is well-developed. She is not ill-appearing.  ?HENT:  ?   Head: Normocephalic and atraumatic.  ?   Nose: Congestion present.  ?   Mouth/Throat:  ?   Mouth: Mucous membranes are moist.  ?   Comments: + copious PND ?Eyes:  ?   Conjunctiva/sclera: Conjunctivae normal.  ?   Pupils: Pupils are equal, round, and reactive to light.  ?Cardiovascular:  ?   Rate and Rhythm: Normal rate and regular rhythm.  ?   Heart sounds: Normal heart sounds. No murmur heard. ?Pulmonary:  ?   Effort: Pulmonary effort is normal. No respiratory distress.  ?   Breath sounds: Wheezing (faint expiratory wheeze in upper lung fields) present.  ?Musculoskeletal:  ?   Cervical back: Normal range of motion and neck supple.  ?Lymphadenopathy:  ?   Cervical: No cervical adenopathy.  ?Skin: ?   General: Skin is warm and dry.  ?Neurological:  ?   Mental Status: She is alert.  ? ? ? ? ? ?   ?Assessment & Plan:  ?Viral URI- new.  No evidence  of bacterial infxn on exam.  No need for abx.  Suspect this is a viral/allergy combo.  Start daily Zyrtec.  Albuterol prn for cough/wheezing/SOB.  Cough meds prn.  Continue flonase.  Reviewed supportive care and red flags that should prompt return.  Pt expressed understanding and is in agreement w/ plan.  ? ?

## 2021-08-23 MED ORDER — GUAIFENESIN-CODEINE 100-10 MG/5ML PO SYRP: 10.0000 mL | ORAL_SOLUTION | Freq: Three times a day (TID) | ORAL | 0 refills | Status: DC | PRN

## 2021-08-24 ENCOUNTER — Encounter: Payer: Self-pay | Admitting: Family Medicine

## 2021-08-29 ENCOUNTER — Encounter: Payer: Self-pay | Admitting: Family Medicine

## 2021-08-29 ENCOUNTER — Ambulatory Visit (INDEPENDENT_AMBULATORY_CARE_PROVIDER_SITE_OTHER): Payer: Medicare Other | Admitting: Family Medicine

## 2021-08-29 ENCOUNTER — Telehealth: Payer: Self-pay | Admitting: Family Medicine

## 2021-08-29 VITALS — BP 128/64 | HR 87 | Temp 97.3°F | Resp 16 | Wt 150.4 lb

## 2021-08-29 DIAGNOSIS — J329 Chronic sinusitis, unspecified: Secondary | ICD-10-CM | POA: Diagnosis not present

## 2021-08-29 DIAGNOSIS — B9689 Other specified bacterial agents as the cause of diseases classified elsewhere: Secondary | ICD-10-CM | POA: Diagnosis not present

## 2021-08-29 MED ORDER — DOXYCYCLINE HYCLATE 100 MG PO TABS: 100.0000 mg | ORAL_TABLET | Freq: Two times a day (BID) | ORAL | 0 refills | Status: DC

## 2021-08-29 NOTE — Patient Instructions (Signed)
Follow up as needed or as scheduled ?START the Doxycycline twice daily- take w/ food ?Drink LOTS of fluids ?Use the albuterol inhaler for cough or wheezing ?Call with any questions or concerns ?Hang in there!!! ?

## 2021-08-29 NOTE — Telephone Encounter (Signed)
Chief Complaint Nasal Congestion ?Reason for Call Symptomatic / Request for Health Information ?Initial Comment Caller states the Dr. gave her cough medication but ?she is still having nasal congestion. ?Translation No ?Nurse Assessment ?Nurse: Eugenio Hoes, RN, Sarah Date/Time Eilene Ghazi Time): 08/27/2021 1:54:30 PM ?Confirm and document reason for call. If ?symptomatic, describe symptoms. ?---Caller states she was seen in the office on Monday ?for a cough. She was started on cough medicine, ?Zyrtec, tesalon pearls, and Flonase. She reports cough ?has improved. She reports sinus pressure and nasal ?congestion continuing. She denies current fever ?Does the patient have any new or worsening ?symptoms? ---Yes ?Will a triage be completed? ---Yes ?Related visit to physician within the last 2 weeks? ---Yes ?Does the PT have any chronic conditions? (i.e. ?diabetes, asthma, this includes High risk factors for ?pregnancy, etc.) ?---No ?Is this a behavioral health or substance abuse call? ---No ?Guidelines ?Guideline Title Affirmed Question Affirmed Notes Nurse Date/Time (Eastern ?Time) ?Sinus Pain or ?Congestion ?[1] Sinus congestion ?(pressure, fullness) ?AND [2] present > 10 ?days ?Eugenio Hoes, RN, Sarah 08/27/2021 1:59:22 ?PM ?Disp. Time (Eastern ?Time) Disposition Final User ?PLEASE NOTE: All timestamps contained within this report are represented as [place] Standard Time. ?CONFIDENTIALTY NOTICE: This fax transmission is intended only for the addressee. It contains information that is legally privileged, confidential or ?otherwise protected from use or disclosure. If you are not the intended recipient, you are strictly prohibited from reviewing, disclosing, copying using ?or disseminating any of this information or taking any action in reliance on or regarding this information. If you have received this fax in error, please ?notify us immediately by telephone so that we can arrange for its return to [place]. Phone: [phone], Toll-Free:  [phone], Fax: [phone] ?Page: 2 of 2 ?Call Id: [id number] ?08/27/2021 2:02:52 PM SEE PCP WITHIN 3 DAYS Yes Eugenio Hoes, RN, Sarah ?Caller Disagree/Comply Comply ?Caller Understands Yes ?PreDisposition Did not know what to do ?Care Advice Given Per Guideline ?SEE PCP WITHIN 3 DAYS: * PCP VISIT: Call your doctor (or NP/PA) during regular office hours and make an appointment. A ?clinic or urgent care center are good places to go for care if your doctor's office is closed or you can't get an appointment. NOTE: ?If office will be open tomorrow, tell caller to call then, not in 3 days. NASAL WASHES FOR A STUFFY NOSE: * Introduction: ?Saline (salt water) nasal irrigation (nasal wash) is an effective and simple home remedy for treating stuffy nose and sinus congestion. ?The nose can be irrigated by pouring, spraying, or squirting salt water into the nose and then letting it run back out. * How it Helps: ?The salt water rinses out excess mucus and washes out any irritants (dust, allergens) that might be present. It also moistens the nasal ?cavity. PAIN MEDICINES: * For pain relief, you can take either acetaminophen, ibuprofen, or naproxen. * They are over-thecounter (OTC) pain drugs. You can buy them at the drugstore. CALL BACK IF: * Difficulty breathing (and not relieved by cleaning ?out nose) * You become worse CARE ADVICE given per Sinus Pain or Congestion (Adult) guideline. ?

## 2021-08-29 NOTE — Progress Notes (Signed)
? ?  Subjective:  ? ? Patient ID: Katherine Blake, female    DOB: [birth year], 76 y.o.   MRN: [id number] ? ?HPI ?URI- pt was seen on 3/13 and dx'd w/ viral URI.  Reports sxs worsened over the weekend.  Pt reports cough has improved but headache, congestion, facial pain, tooth pain all 'took off'.  No fevers.  Pt reports drainage is now 'real yellow' as opposed to clear.  No visible blood.  Bilateral ear fullness w/ popping. ? ? ?Review of Systems ?For ROS see HPI  ? ?This visit occurred during the SARS-CoV-2 public health emergency.  Safety protocols were in place, including screening questions prior to the visit, additional usage of staff PPE, and extensive cleaning of exam room while observing appropriate contact time as indicated for disinfecting solutions.   ?   ?Objective:  ? Physical Exam ?Vitals reviewed.  ?Constitutional:   ?   General: She is not in acute distress. ?   Appearance: Normal appearance. She is well-developed. She is not ill-appearing.  ?HENT:  ?   Head: Normocephalic and atraumatic.  ?   Right Ear: Tympanic membrane normal.  ?   Left Ear: Tympanic membrane normal.  ?   Nose: Mucosal edema and rhinorrhea present.  ?   Right Sinus: Maxillary sinus tenderness present. No frontal sinus tenderness.  ?   Left Sinus: Maxillary sinus tenderness present. No frontal sinus tenderness.  ?   Mouth/Throat:  ?   Pharynx: Uvula midline. Posterior oropharyngeal erythema present. No oropharyngeal exudate.  ?Eyes:  ?   Conjunctiva/sclera: Conjunctivae normal.  ?   Pupils: Pupils are equal, round, and reactive to light.  ?Cardiovascular:  ?   Rate and Rhythm: Normal rate and regular rhythm.  ?   Heart sounds: Normal heart sounds.  ?Pulmonary:  ?   Effort: Pulmonary effort is normal. No respiratory distress.  ?   Breath sounds: Wheezing (end expiratory wheezes diffusely) present.  ?Musculoskeletal:  ?   Cervical back: Normal range of motion and neck supple.  ?Lymphadenopathy:  ?   Cervical: No cervical adenopathy.   ?Skin: ?   General: Skin is warm and dry.  ?Neurological:  ?   General: No focal deficit present.  ?   Mental Status: She is alert and oriented to person, place, and time.  ?   Cranial Nerves: No cranial nerve deficit.  ?   Motor: No weakness.  ?   Coordination: Coordination normal.  ?Psychiatric:     ?   Mood and Affect: Mood normal.     ?   Behavior: Behavior normal.     ?   Thought Content: Thought content normal.  ? ? ? ? ? ?   ?Assessment & Plan:  ? ?Bacterial sinusitis- new.  Pt's sxs and duration of illness consistent w/ secondary bacterial infxn.  Start Doxycycline BID.  Encouraged inhaler use.  Reviewed supportive care and red flags that should prompt return.  Pt expressed understanding and is in agreement w/ plan.  ?

## 2021-09-05 ENCOUNTER — Other Ambulatory Visit: Payer: Self-pay

## 2021-09-05 ENCOUNTER — Encounter: Payer: Self-pay | Admitting: Dermatology

## 2021-09-05 ENCOUNTER — Ambulatory Visit: Payer: Medicare Other | Admitting: Dermatology

## 2021-09-05 DIAGNOSIS — Z8589 Personal history of malignant neoplasm of other organs and systems: Secondary | ICD-10-CM

## 2021-09-05 DIAGNOSIS — Z85828 Personal history of other malignant neoplasm of skin: Secondary | ICD-10-CM

## 2021-09-11 ENCOUNTER — Encounter: Payer: Self-pay | Admitting: Family Medicine

## 2021-09-23 ENCOUNTER — Encounter: Payer: Self-pay | Admitting: Dermatology

## 2021-09-23 NOTE — Progress Notes (Signed)
? ?  Follow-Up Visit ?  ?Subjective  ?Katherine Blake is a 76 y.o. female who presents for the following: Follow-up (Pt here for  scc on the scalp. Pt states that aldara formed scabs, but it worked. Pt stated she couldn't use it the full length of time. ). ? ?Check site of skin cancer on scalp and check for any other spots. ?Location:  ?Duration:  ?Quality:  ?Associated Signs/Symptoms: ?Modifying Factors:  ?Severity:  ?Timing: ?Context:  ? ?Objective  ?Well appearing patient in no apparent distress; mood and affect are within normal limits. ?Scalp ?Clear visibly and by touch.  No other suspicious lesions head and neck. ? ? ? ?A focused examination was performed including head neck.. Relevant physical exam findings are noted in the Assessment and Plan. ? ? ?Assessment & Plan  ? ? ?History of squamous cell carcinoma ?Scalp ? ?Patient told that while there is no indication for any further treatment, it is too early to be certain of cure so she should return if she feels any other crusts particularly on sun exposed areas. ? ? ? ? ? ?I, [name], MD, have reviewed all documentation for this visit.  The documentation on 09/23/21 for the exam, diagnosis, procedures, and orders are all accurate and complete. ?

## 2021-12-16 DIAGNOSIS — H52203 Unspecified astigmatism, bilateral: Secondary | ICD-10-CM | POA: Diagnosis not present

## 2021-12-23 DIAGNOSIS — Z1231 Encounter for screening mammogram for malignant neoplasm of breast: Secondary | ICD-10-CM | POA: Diagnosis not present

## 2021-12-23 LAB — HM MAMMOGRAPHY

## 2021-12-26 ENCOUNTER — Encounter: Payer: Self-pay | Admitting: Nurse Practitioner

## 2021-12-26 ENCOUNTER — Ambulatory Visit (INDEPENDENT_AMBULATORY_CARE_PROVIDER_SITE_OTHER): Payer: Medicare Other | Admitting: Nurse Practitioner

## 2021-12-26 ENCOUNTER — Encounter: Payer: Self-pay | Admitting: Family Medicine

## 2021-12-26 VITALS — BP 132/70 | HR 66 | Ht 61.25 in | Wt 148.0 lb

## 2021-12-26 DIAGNOSIS — H52203 Unspecified astigmatism, bilateral: Secondary | ICD-10-CM | POA: Diagnosis not present

## 2021-12-26 DIAGNOSIS — Z01419 Encounter for gynecological examination (general) (routine) without abnormal findings: Secondary | ICD-10-CM

## 2021-12-26 DIAGNOSIS — N952 Postmenopausal atrophic vaginitis: Secondary | ICD-10-CM

## 2021-12-26 MED ORDER — NONFORMULARY OR COMPOUNDED ITEM: 3 refills | Status: DC

## 2021-12-26 NOTE — Progress Notes (Signed)
   Katherine Blake May 19, 1946 [id number]   History:  76 y.o. G2P2002 presents for breast and pelvic exam. Postmenopausal - no HRT. S/P 1987 TAH BSO for endometriosis. Using vaginal estrogen for dryness and painful intercourse. Normal pap and mammogram history.   Gynecologic History No LMP recorded. Patient has had a hysterectomy.   Contraception/Family planning: status post hysterectomy Sexually active: Yes  Health Maintenance Last Pap: 11/19/2018. Results were: Normal Last mammogram: 12/23/2021. Results were: Normal Last colonoscopy: 2012. Results were: Normal. Negative Cologuard 2021 Last Dexa: 12/30/2019. Results were: Normal  Past medical history, past surgical history, family history and social history were all reviewed and documented in the EPIC chart. Married. Son in [place] - has 33 and 76 yo. Daughter in [place] - has twin 19 yos, boy and girl.   ROS:  A ROS was performed and pertinent positives and negatives are included.  Exam:  Vitals:   12/26/21 0930  BP: 132/70  Pulse: 66  SpO2: 98%  Weight: 148 lb (67.1 kg)  Height: 5' 1.25" (1.556 m)    Body mass index is 27.74 kg/m.  General appearance:  Normal Thyroid:  Symmetrical, normal in size, without palpable masses or nodularity. Respiratory  Auscultation:  Clear without wheezing or rhonchi Cardiovascular  Auscultation:  Regular rate, without rubs, murmurs or gallops  Edema/varicosities:  Not grossly evident Abdominal  Soft,nontender, without masses, guarding or rebound.  Liver/spleen:  No organomegaly noted  Hernia:  None appreciated  Skin  Inspection:  Grossly normal Breasts: Examined lying and sitting.   Right: Without masses, retractions, nipple discharge or axillary adenopathy.   Left: Without masses, retractions, nipple discharge or axillary adenopathy. Genitourinary   Inguinal/mons:  Normal without inguinal adenopathy  External genitalia:  Normal appearing vulva with no masses, tenderness, or  lesions  BUS/Urethra/Skene's glands:  Normal  Vagina:  Normal appearing with normal color and discharge, no lesions. Atrophic changes  Cervix:  Absent  Uterus:  Absent  Anus and perineum: Normal  Digital rectal exam: Normal sphincter tone without palpated masses or tenderness  Patient informed chaperone available to be present for breast and pelvic exam. Patient has requested no chaperone to be present. Patient has been advised what will be completed during breast and pelvic exam.   Assessment/Plan:  76 y.o. G2P2002 for annual exam.   Well female exam with routine gynecological exam - Education provided on SBEs, importance of preventative screenings, current guidelines, high calcium diet, regular exercise, and multivitamin daily. Labs with PCP.   Postmenopausal - No HRT. S/P 1987 TAH BSO for endometriosis  Postmenopausal atrophic vaginitis - Plan: NONFORMULARY OR COMPOUNDED ITEM. Using compounded vaginal estrogen twice weekly with good management. She would like to continue. Refills provided.   Screening for cervical cancer - Normal Pap history.  No longer screening per guidelines.   Screening for breast cancer - Normal mammogram history.  Continue annual screenings.  Normal breast exam today.  Screening for colon cancer - 2012 colonoscopy. 2021 negative Cologuard.   Screening for osteoporosis - Normal bone density 2021. Will repeat at 5-year interval.   Return in 1 year for mediation follow up.   Tamela Gammon DNP, 9:48 AM 12/26/2021

## 2022-01-02 ENCOUNTER — Ambulatory Visit (INDEPENDENT_AMBULATORY_CARE_PROVIDER_SITE_OTHER): Payer: Medicare Other | Admitting: Family Medicine

## 2022-01-02 ENCOUNTER — Encounter: Payer: Self-pay | Admitting: Family Medicine

## 2022-01-02 VITALS — BP 118/80 | HR 79 | Temp 97.7°F | Resp 16 | Ht 62.0 in | Wt 147.4 lb

## 2022-01-02 DIAGNOSIS — E785 Hyperlipidemia, unspecified: Secondary | ICD-10-CM

## 2022-01-02 DIAGNOSIS — Z0000 Encounter for general adult medical examination without abnormal findings: Secondary | ICD-10-CM

## 2022-01-02 LAB — HEPATIC FUNCTION PANEL
ALT: 20 U/L (ref 0–35)
AST: 21 U/L (ref 0–37)
Albumin: 4.4 g/dL (ref 3.5–5.2)
Alkaline Phosphatase: 49 U/L (ref 39–117)
Bilirubin, Direct: 0.1 mg/dL (ref 0.0–0.3)
Total Bilirubin: 0.5 mg/dL (ref 0.2–1.2)
Total Protein: 6.6 g/dL (ref 6.0–8.3)

## 2022-01-02 LAB — CBC WITH DIFFERENTIAL/PLATELET
Basophils Absolute: 0 10*3/uL (ref 0.0–0.1)
Basophils Relative: 1 % (ref 0.0–3.0)
Eosinophils Absolute: 0.1 10*3/uL (ref 0.0–0.7)
Eosinophils Relative: 3.9 % (ref 0.0–5.0)
HCT: 37.3 % (ref 36.0–46.0)
Hemoglobin: 12.4 g/dL (ref 12.0–15.0)
Lymphocytes Relative: 36.3 % (ref 12.0–46.0)
Lymphs Abs: 1.3 10*3/uL (ref 0.7–4.0)
MCHC: 33.1 g/dL (ref 30.0–36.0)
MCV: 88.3 fl (ref 78.0–100.0)
Monocytes Absolute: 0.3 10*3/uL (ref 0.1–1.0)
Monocytes Relative: 6.8 % (ref 3.0–12.0)
Neutro Abs: 1.9 10*3/uL (ref 1.4–7.7)
Neutrophils Relative %: 52 % (ref 43.0–77.0)
Platelets: 260 10*3/uL (ref 150.0–400.0)
RBC: 4.22 Mil/uL (ref 3.87–5.11)
RDW: 13.7 % (ref 11.5–15.5)
WBC: 3.7 10*3/uL — ABNORMAL LOW (ref 4.0–10.5)

## 2022-01-02 LAB — BASIC METABOLIC PANEL
BUN: 12 mg/dL (ref 6–23)
CO2: 29 mEq/L (ref 19–32)
Calcium: 9.3 mg/dL (ref 8.4–10.5)
Chloride: 105 mEq/L (ref 96–112)
Creatinine, Ser: 0.68 mg/dL (ref 0.40–1.20)
GFR: 85.86 mL/min (ref >60.00)
Glucose, Bld: 89 mg/dL (ref 70–99)
Potassium: 4.3 mEq/L (ref 3.5–5.1)
Sodium: 140 mEq/L (ref 135–145)

## 2022-01-02 LAB — TSH: TSH: 2.16 u[IU]/mL (ref 0.35–5.50)

## 2022-01-02 LAB — LIPID PANEL
Cholesterol: 211 mg/dL — ABNORMAL HIGH (ref 0–200)
HDL: 62.3 mg/dL (ref >39.00)
LDL Cholesterol: 115 mg/dL — ABNORMAL HIGH (ref 0–99)
NonHDL: 148.49
Total CHOL/HDL Ratio: 3
Triglycerides: 165 mg/dL — ABNORMAL HIGH (ref 0.0–149.0)
VLDL: 33 mg/dL (ref 0.0–40.0)

## 2022-01-02 NOTE — Progress Notes (Signed)
   Subjective:    Patient ID: Katherine Blake, female    DOB: 1945-11-11, 76 y.o.   MRN: [id number]  HPI CPE- UTD on cologuard, mammo, Tdap, PNA.  Pt reports feeling good.  Patient Care Team    Relationship Specialty Notifications Start End  Midge Minium, MD PCP - General Family Medicine  02/22/17   Harold Hedge, Darrick Grinder, MD Consulting Physician Allergy and Immunology  02/22/17   Anastasio Auerbach, MD (Inactive) Consulting Physician Gynecology  02/22/17   Juanita Craver, MD Consulting Physician Gastroenterology  02/22/17   [name], MD Consulting Physician Dermatology  08/30/17   Pedro Earls, MD Attending Physician Family Medicine  08/30/17    Comment: orthopedic  Mariea Clonts  Dentistry  08/30/17     Health Maintenance  Topic Date Due   INFLUENZA VACCINE  01/10/2022   Fecal DNA (Cologuard)  08/03/2022   MAMMOGRAM  12/24/2023   TETANUS/TDAP  12/26/2027   Pneumonia Vaccine 17+ Years old  Completed   DEXA SCAN  Completed   HPV VACCINES  Aged Out   COLONOSCOPY (Pts 45-77yr Insurance coverage will need to be confirmed)  Discontinued   COVID-19 Vaccine  Discontinued   Hepatitis C Screening  Discontinued   Zoster Vaccines- Shingrix  Discontinued      Review of Systems Patient reports no vision/ hearing changes, adenopathy,fever, weight change,  persistant/recurrent hoarseness , swallowing issues, chest pain, palpitations, edema, persistant/recurrent cough, hemoptysis, dyspnea (rest/exertional/paroxysmal nocturnal), gastrointestinal bleeding (melena, rectal bleeding), abdominal pain, significant heartburn, bowel changes, GU symptoms (dysuria, hematuria, incontinence), Gyn symptoms (abnormal  bleeding, pain),  syncope, focal weakness, memory loss, numbness & tingling, skin/hair/nail changes, abnormal bruising or bleeding, anxiety, or depression.     Objective:   Physical Exam General Appearance:    Alert, cooperative, no distress, appears stated age  Head:    Normocephalic, without  obvious abnormality, atraumatic  Eyes:    PERRL, conjunctiva/corneas clear, EOM's intact both eyes  Ears:    Normal TM's and external ear canals, both ears  Nose:   Nares normal, septum midline, mucosa normal, no drainage    or sinus tenderness  Throat:   Lips, mucosa, and tongue normal; teeth and gums normal  Neck:   Supple, symmetrical, trachea midline, no adenopathy;    Thyroid: no enlargement/tenderness/nodules  Back:     Symmetric, no curvature, ROM normal, no CVA tenderness  Lungs:     Clear to auscultation bilaterally, respirations unlabored  Chest Wall:    No tenderness or deformity   Heart:    Regular rate and rhythm, S1 and S2 normal, no murmur, rub   or gallop  Breast Exam:    Deferred to mammo  Abdomen:     Soft, non-tender, bowel sounds active all four quadrants,    no masses, no organomegaly  Genitalia:    Deferred  Rectal:    Extremities:   Extremities normal, atraumatic, no cyanosis or edema  Pulses:   2+ and symmetric all extremities  Skin:   Skin color, texture, turgor normal, no rashes or lesions  Lymph nodes:   Cervical, supraclavicular, and axillary nodes normal  Neurologic:   CNII-XII intact, normal strength, sensation and reflexes    throughout          Assessment & Plan:

## 2022-01-02 NOTE — Patient Instructions (Signed)
Follow up in 1 year or as needed We'll notify you of your lab results and make any changes if needed Continue to work on healthy diet and regular exercise- you look great! Call with any questions or concerns Stay Safe!  Stay Healthy! Enjoy the rest of your summer!!!

## 2022-01-02 NOTE — Assessment & Plan Note (Signed)
Chronic problem.  Attempting to control w/ diet and exercise and OTC Red Yeast Rice.  Check labs and start meds prn.

## 2022-01-02 NOTE — Assessment & Plan Note (Signed)
Pt's PE WNL.  UTD on mammo, cologuard, immunizations.  Check labs.  Anticipatory guidance provided.

## 2022-01-03 NOTE — Progress Notes (Signed)
Pt seen results via my chart  

## 2022-01-10 IMAGING — CT CT ANGIO HEAD
1 of 8 series · 14 of 47 positions shown · IV contrast (OMNI)
Comparison: None.

CLINICAL DATA: Central vertigo. Fall.

EXAM:
CT ANGIOGRAPHY HEAD AND NECK
TECHNIQUE: Multidetector CT imaging of the head and neck was performed using
the standard protocol during bolus administration of intravenous
contrast. Multiplanar CT image reconstructions and MIPs were
obtained to evaluate the vascular anatomy. Carotid stenosis
measurements (when applicable) are obtained utilizing NASCET
criteria, using the distal internal carotid diameter as the
denominator.
CONTRAST:  75mL OMNIPAQUE IOHEXOL 350 MG/ML SOLN

[Series 10: thin · axial · 0.48mm/px · z∈[-248,+35]mm · 14 of 654 slices shown]
[im 44/654  brain]
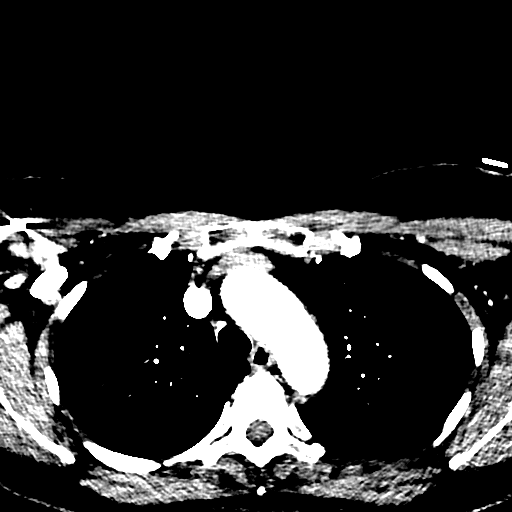
[im 88/654  bone]
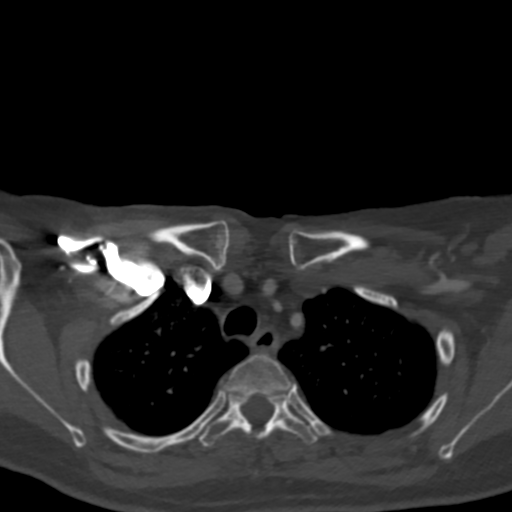
[im 131/654  brain]
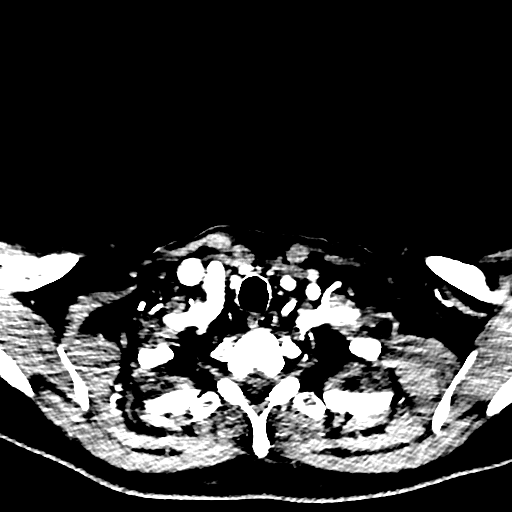
[im 175/654  bone]
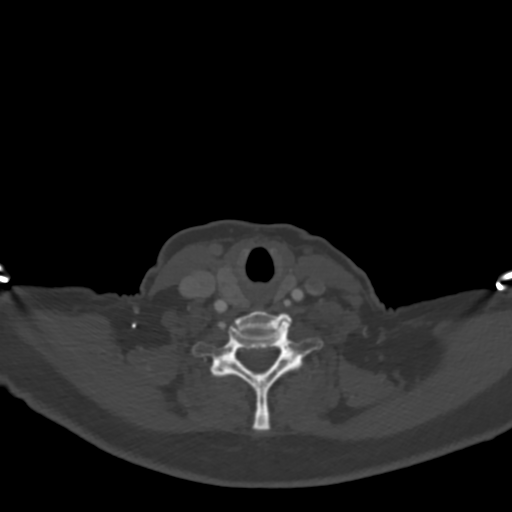
[im 218/654  brain]
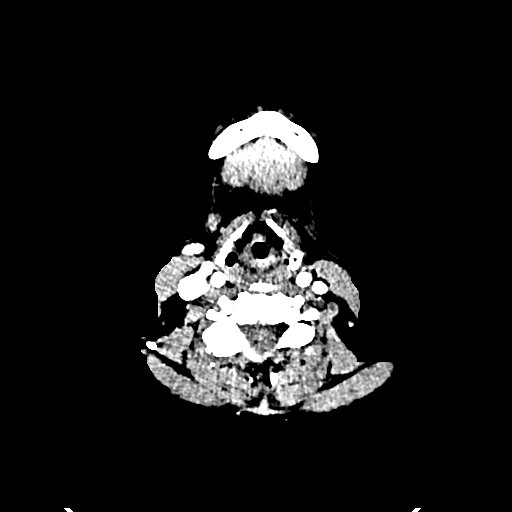
[im 262/654  bone]
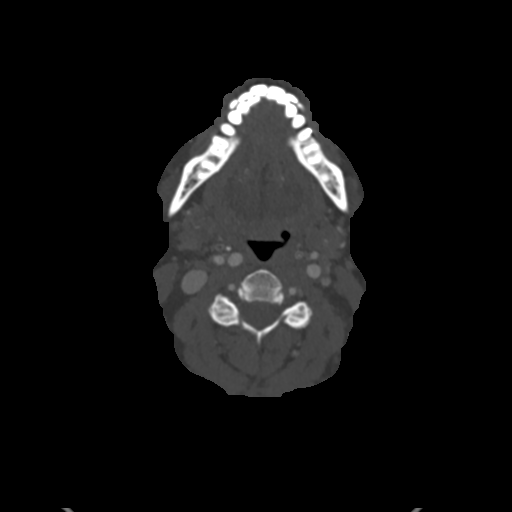
[im 305/654  brain]
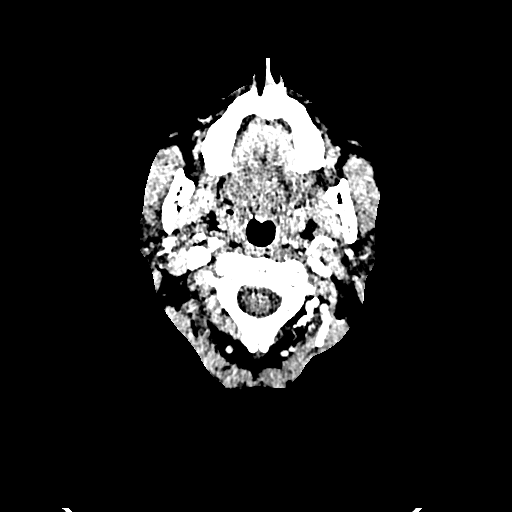
[im 349/654  bone]
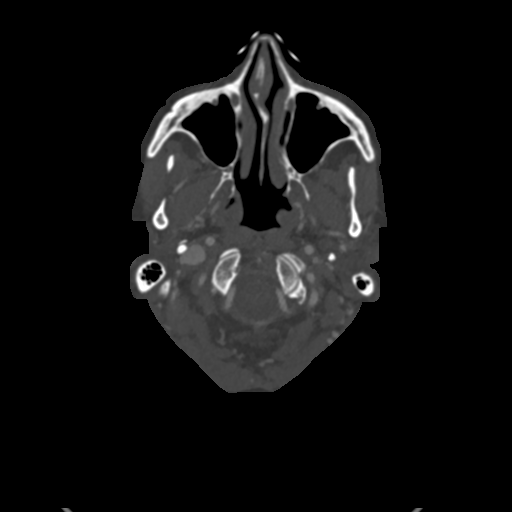
[im 392/654  brain]
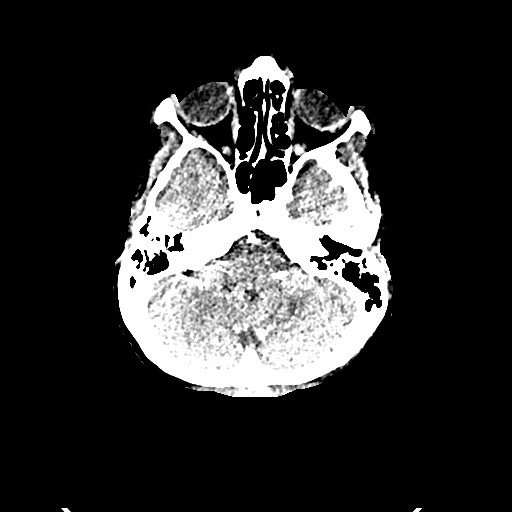
[im 436/654  bone]
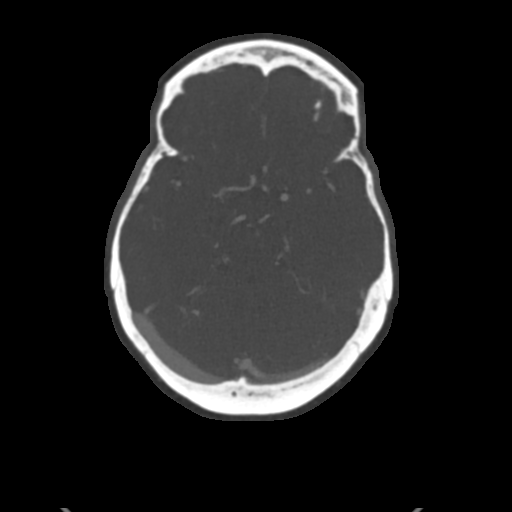
[im 479/654  brain]
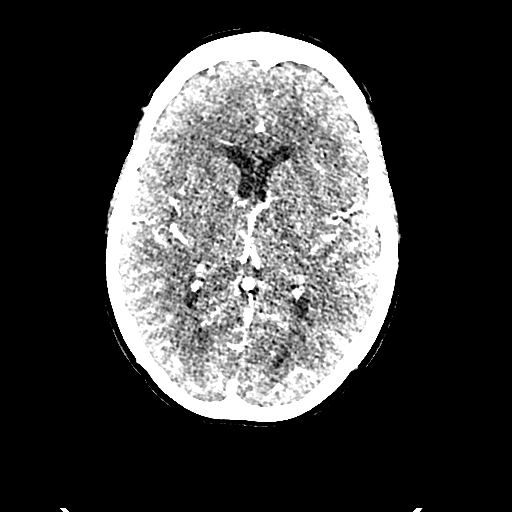
[im 523/654  bone]
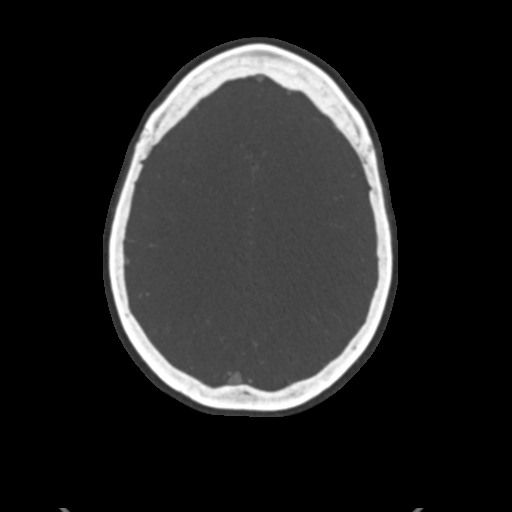
[im 566/654  brain]
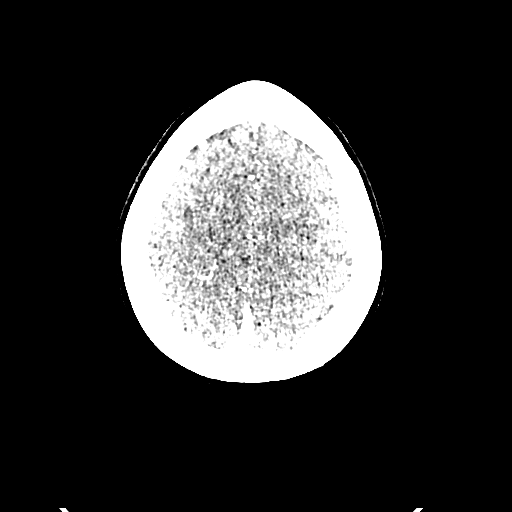
[im 610/654  bone]
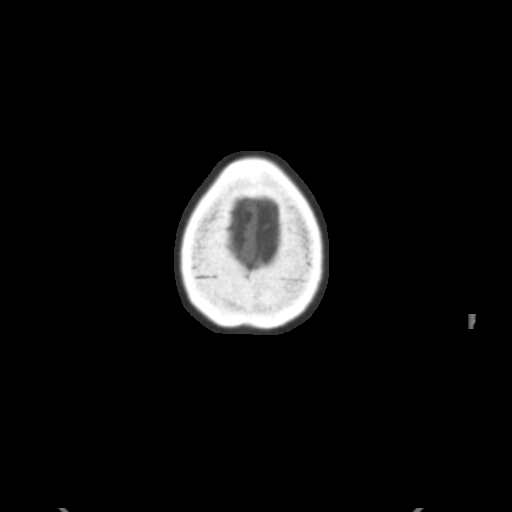

[14 of 47 positions shown; findings below may reference images not displayed]

FINDINGS: CT HEAD FINDINGS

Brain: There is no mass, hemorrhage or extra-axial collection. The
size and configuration of the ventricles and extra-axial CSF spaces
are normal. There is no acute or chronic infarction. The brain
parenchyma is normal.

Skull: The visualized skull base, calvarium and extracranial soft
tissues are normal.

Sinuses/Orbits: No fluid levels or advanced mucosal thickening of
the visualized paranasal sinuses. No mastoid or middle ear effusion.
The orbits are normal.

CTA NECK FINDINGS

SKELETON: There is no bony spinal canal stenosis. No lytic or
blastic lesion.

OTHER NECK: Normal pharynx, larynx and major salivary glands. No
cervical lymphadenopathy. Unremarkable thyroid gland.

UPPER CHEST: No pneumothorax or pleural effusion. No nodules or
masses.

AORTIC ARCH:

There is no calcific atherosclerosis of the aortic arch. There is no
aneurysm, dissection or hemodynamically significant stenosis of the
visualized portion of the aorta. Normal variant aortic arch
branching pattern with the left vertebral artery arising
independently from the aortic arch. The visualized proximal
subclavian arteries are widely patent.

RIGHT CAROTID SYSTEM: Normal without aneurysm, dissection or
stenosis.

LEFT CAROTID SYSTEM: Normal without aneurysm, dissection or
stenosis.

VERTEBRAL ARTERIES: Left dominant configuration. Both origins are
clearly patent. There is no dissection, occlusion or flow-limiting
stenosis to the skull base (V1-V3 segments).

CTA HEAD FINDINGS

POSTERIOR CIRCULATION:

--Vertebral arteries: Normal V4 segments.

--Posterior inferior cerebellar arteries (PICA): Patent origins from
the vertebral arteries.

--Anterior inferior cerebellar arteries (AICA): Patent origins from
the basilar artery.

--Basilar artery: Normal.

--Superior cerebellar arteries: Normal.

--Posterior cerebral arteries: Normal. Both originate from the
basilar artery. Posterior communicating arteries (p-comm) are
diminutive or absent.

ANTERIOR CIRCULATION:

--Intracranial internal carotid arteries: Normal.

--Anterior cerebral arteries (ACA): Normal. Both A1 segments are
present. Patent anterior communicating artery (a-comm).

--Middle cerebral arteries (MCA): Normal.

VENOUS SINUSES: As permitted by contrast timing, patent.

ANATOMIC VARIANTS: None

Review of the MIP images confirms the above findings.
IMPRESSION: Normal CTA of the head and neck.

## 2022-02-15 ENCOUNTER — Ambulatory Visit (INDEPENDENT_AMBULATORY_CARE_PROVIDER_SITE_OTHER): Payer: Medicare Other

## 2022-02-15 VITALS — Ht 63.0 in | Wt 147.0 lb

## 2022-02-15 DIAGNOSIS — Z0000 Encounter for general adult medical examination without abnormal findings: Secondary | ICD-10-CM

## 2022-02-15 NOTE — Progress Notes (Signed)
Subjective:   Katherine Blake is a 76 y.o. female who presents for Medicare Annual (Subsequent) preventive examination.   Virtual Visit via Telephone Note  I connected with  Katherine Blake on 02/15/22 at 11:00 AM EDT by telephone and verified that I am speaking with the correct person using two identifiers.  Location: Patient: home  Provider: [place]  Persons participating in the virtual visit: patient/Nurse Health Advisor   I discussed the limitations, risks, security and privacy concerns of performing an evaluation and management service by telephone and the availability of in person appointments. The patient expressed understanding and agreed to proceed.  Interactive audio and video telecommunications were attempted between this nurse and patient, however failed, due to patient having technical difficulties OR patient did not have access to video capability.  We continued and completed visit with audio only.  Some vital signs may be absent or patient reported.   Daphane Shepherd, LPN  Review of Systems     Cardiac Risk Factors include: advanced age (>8mn, >>43women);dyslipidemia     Objective:    Today's Vitals   02/15/22 1105  Weight: 147 lb (66.7 kg)  Height: 5' 3"  (1.6 m)   Body mass index is 26.04 kg/m.     02/15/2022   11:13 AM 01/10/2021    9:07 AM 01/06/2020    9:47 AM 09/11/2019    6:57 PM 11/13/2018   10:09 AM 08/30/2017    8:19 AM  Advanced Directives  Does Patient Have a Medical Advance Directive? Yes Yes Yes No Yes Yes  Type of AParamedicof ACanalouLiving will H[place]okstonLiving will [place]matillaLiving will  HBarneveldLiving will [place]arltonLiving will  Copy of [place] Chart? No - copy requested Yes - validated most recent copy scanned in chart (See row information) No - copy requested  Yes - validated most recent copy scanned in chart  (See row information) No - copy requested  Would patient like information on creating a medical advance directive?    No - Patient declined      Current Medications (verified) Outpatient Encounter Medications as of 02/15/2022  Medication Sig   Calcium Carbonate-Vitamin D (CALCIUM + D PO) Take 1 tablet by mouth daily.    Cardiac Defibrillators KIT Only to use in emergencies- unresponsiveness or cardiac distress   cetirizine (ZYRTEC) 10 MG tablet Take 1 tablet (10 mg total) by mouth daily.   fluticasone (FLONASE) 50 MCG/ACT nasal spray Place 1 spray into both nostrils every evening.    metoprolol succinate (TOPROL XL) 25 MG 24 hr tablet Take 0.5 tablets (12.5 mg total) by mouth daily.   Red Yeast Rice Extract (RED YEAST RICE PO) Take by mouth.   No facility-administered encounter medications on file as of 02/15/2022.    Allergies (verified) Amoxicillin and Cephalexin   History: Past Medical History:  Diagnosis Date   Allergy    History of chicken pox    SCC (squamous cell carcinoma) 04/10/2016   left scalp cx3 540f  SCC (squamous cell carcinoma) 04/10/2016   left forehaed cx3 [id number] Squamous cell carcinoma of skin 05/09/2005   top scalp cx3 [id number] Surgical History:  Procedure Laterality Date   ABDOMINAL HYSTERECTOMY  1987   TAH,BSO Endometriosis   CESAREAN SECTION     X 2   OOPHTwining  Family History  Problem Relation Age of Onset   Dementia Mother    Thrombocytopenia Mother    Other Mother        JAK2 positive   Heart failure Father    Cancer Maternal Grandmother        Leukemia   Social History   Socioeconomic History   Marital status: Married    Spouse name: Not on file   Number of children: 2   Years of education: Not on file   Highest education level: Not on file  Occupational History   Occupation: REtired   Occupation: Actor  Tobacco Use   Smoking status: Never   Smokeless tobacco: Never  Vaping  Use   Vaping Use: Never used  Substance and Sexual Activity   Alcohol use: Yes    Alcohol/week: 2.0 standard drinks of alcohol    Types: 2 Standard drinks or equivalent per week   Drug use: Never   Sexual activity: Yes    Birth control/protection: Surgical    Comment: first intercourse >16, less than 5 partners  Other Topics Concern   Not on file  Social History Narrative   Not on file   Social Determinants of Health   Financial Resource Strain: Low Risk  (02/15/2022)   Overall Financial Resource Strain (CARDIA)    Difficulty of Paying Living Expenses: Not hard at all  Food Insecurity: No Food Insecurity (02/15/2022)   Hunger Vital Sign    Worried About Running Out of Food in the Last Year: Never true    Ran Out of Food in the Last Year: Never true  Transportation Needs: No Transportation Needs (02/15/2022)   PRAPARE - Hydrologist (Medical): No    Lack of Transportation (Non-Medical): No  Physical Activity: Insufficiently Active (02/15/2022)   Exercise Vital Sign    Days of Exercise per Week: 3 days    Minutes of Exercise per Session: 40 min  Stress: No Stress Concern Present (02/15/2022)   [place]    Feeling of Stress : Not at all  Social Connections: Socially Integrated (02/15/2022)   Social Connection and Isolation Panel [NHANES]    Frequency of Communication with Friends and Family: More than three times a week    Frequency of Social Gatherings with Friends and Family: More than three times a week    Attends Religious Services: More than 4 times per year    Active Member of [employer] or Organizations: Yes    Attends Music therapist: More than 4 times per year    Marital Status: Married    Tobacco Counseling Counseling given: Not Answered   Clinical Intake:  Pre-visit preparation completed: Yes  Pain : No/denies pain     Nutritional Risks: None Diabetes:  No  How often do you need to have someone help you when you read instructions, pamphlets, or other written materials from your doctor or pharmacy?: 1 - Never  Diabetic?no   Interpreter Needed?: No  Information entered by :: Jadene Pierini , LPN   Activities of Daily Living    02/15/2022   11:14 AM 01/02/2022    9:01 AM  In your present state of health, do you have any difficulty performing the following activities:  Hearing? 0 0  Vision? 0 0  Difficulty concentrating or making decisions? 0 0  Walking or climbing stairs? 0 0  Dressing or bathing? 0 0  Doing errands, shopping? 0 0  Preparing Food and eating ? N   Using the Toilet? N   In the past six months, have you accidently leaked urine? N   Do you have problems with loss of bowel control? N   Managing your Medications? N   Managing your Finances? N   Housekeeping or managing your Housekeeping? N     Patient Care Team: Midge Minium, MD as PCP - General (Family Medicine) Harold Hedge, Darrick Grinder, MD as Consulting Physician (Allergy and Immunology) Phineas Real Belinda Block, MD (Inactive) as Consulting Physician (Gynecology) Juanita Craver, MD as Consulting Physician (Gastroenterology) [name], MD as Consulting Physician (Dermatology) Pedro Earls, MD as Attending Physician (Family Medicine) Mariea Clonts (Dentistry)  Indicate any recent Medical Services you may have received from other than [facility] providers in the past year (date may be approximate).     Assessment:   This is a routine wellness examination for [place].  Hearing/Vision screen Vision Screening - Comments:: Annual eye wears glasses/contacts   Dietary issues and exercise activities discussed: Current Exercise Habits: Home exercise routine, Time (Minutes): 40, Frequency (Times/Week): 3, Weekly Exercise (Minutes/Week): 120, Intensity: Mild, Exercise limited by: None identified   Goals Addressed             This Visit's Progress    Patient Stated   On track     Maintain current health by staying active.        Depression Screen    02/15/2022   11:12 AM 01/02/2022    9:01 AM 08/22/2021    9:33 AM 01/10/2021    9:22 AM 11/29/2020    2:29 PM 05/24/2020    9:05 AM 01/06/2020    9:50 AM  PHQ 2/9 Scores  PHQ - 2 Score 0 0 0 0 0 0 0  PHQ- 9 Score 0 0 0  0 0     Fall Risk    02/15/2022   11:07 AM 01/02/2022    9:01 AM 08/22/2021    9:33 AM 01/10/2021    9:08 AM 11/29/2020    2:29 PM  [place] in the past year? 0 0 0 0 0  Number falls in past yr: 0 0  0 0  Injury with Fall? 0 0  0 0  Risk for fall due to : No Fall Risks No Fall Risks No Fall Risks  No Fall Risks  Follow up Falls prevention discussed Falls evaluation completed Falls evaluation completed Falls evaluation completed;Falls prevention discussed     FALL RISK PREVENTION PERTAINING TO THE HOME:  Any stairs in or around the home? Yes  If so, are there any without handrails? Yes  Home free of loose throw rugs in walkways, pet beds, electrical cords, etc? No  Adequate lighting in your home to reduce risk of falls? Yes   ASSISTIVE DEVICES UTILIZED TO PREVENT FALLS:  Life alert? No  Use of a cane, walker or w/c? No  Grab bars in the bathroom? Yes  Shower chair or bench in shower? Yes  Elevated toilet seat or a handicapped toilet? No       11/13/2018   10:11 AM  MMSE - Mini Mental State Exam  Orientation to time 5  Orientation to Place 5  Registration 3  Attention/ Calculation 5  Recall 1  Language- name 2 objects 2  Language- repeat 1  Language- follow 3 step command 3  Language- read & follow direction 1  Write a sentence 1  Copy design 1  Total  score 28        02/15/2022   11:14 AM 01/06/2020    9:56 AM  6CIT Screen  What Year? 0 points 0 points  What month? 0 points 0 points  What time? 0 points 0 points  Count back from 20 0 points 0 points  Months in reverse 0 points 0 points  Repeat phrase 0 points 0 points  Total Score 0 points 0 points     Immunizations Immunization History  Administered Date(s) Administered   Fluad Quad(high Dose 65+) 02/17/2020   Influenza, High Dose Seasonal PF 02/12/2015, 03/02/2019   Influenza,inj,Quad PF,6+ Mos 03/13/2016, 02/22/2017, 02/01/2018   Influenza-Unspecified 03/13/2016, 01/10/2021   PFIZER SARS-COV-2 Pediatric Vaccination 5-57yr 09/21/2020   PFIZER(Purple Top)SARS-COV-2 Vaccination 07/03/2019, 07/22/2019, 03/15/2020   Pneumococcal Conjugate-13 12/04/2013   Pneumococcal Polysaccharide-23 08/30/2017   Tdap 12/25/2017   Zoster Recombinat (Shingrix) 10/18/2010   Zoster, Live 10/18/2010    TDAP status: Up to date  Flu Vaccine status: Up to date  Pneumococcal vaccine status: Up to date  Covid-19 vaccine status: Completed vaccines  Qualifies for Shingles Vaccine? Yes   Zostavax completed Yes   Shingrix Completed?: Yes  Screening Tests Health Maintenance  Topic Date Due   INFLUENZA VACCINE  01/10/2022   Fecal DNA (Cologuard)  08/03/2022   MAMMOGRAM  12/24/2022   TETANUS/TDAP  12/26/2027   Pneumonia Vaccine 74 Years old  Completed   DEXA SCAN  Completed   HPV VACCINES  Aged Out   COLONOSCOPY (Pts 45-441yrInsurance coverage will need to be confirmed)  Discontinued   COVID-19 Vaccine  Discontinued   Hepatitis C Screening  Discontinued   Zoster Vaccines- Shingrix  Discontinued    Health Maintenance  Health Maintenance Due  Topic Date Due   INFLUENZA VACCINE  01/10/2022    Colorectal cancer screening: Type of screening: Cologuard. Completed 02/22/213. Repeat every 3 years  Mammogram status: Completed 12/23/2021. Repeat every year  Bone Density status: Completed 12/30/2019. Results reflect: Bone density results: OSTEOPENIA. Repeat every 5 years.  Lung Cancer Screening: (Low Dose CT Chest recommended if Age 74-80ears, 30 pack-year currently smoking OR have quit w/in 15years.) does not qualify.   Lung Cancer Screening Referral: n/a  Additional  Screening:  Hepatitis C Screening: does not qualify;   Vision Screening: Recommended annual ophthalmology exams for early detection of glaucoma and other disorders of the eye. Is the patient up to date with their annual eye exam?  Yes  Who is the provider or what is the name of the office in which the patient attends annual eye exams? Dr. GoDelman CheadleIf pt is not established with a provider, would they like to be referred to a provider to establish care? No .   Dental Screening: Recommended annual dental exams for proper oral hygiene  Community Resource Referral / Chronic Care Management: CRR required this visit?  No   CCM required this visit?  No      Plan:     I have personally reviewed and noted the following in the patient's chart:   Medical and social history Use of alcohol, tobacco or illicit drugs  Current medications and supplements including opioid prescriptions. Patient is not currently taking opioid prescriptions. Functional ability and status Nutritional status Physical activity Advanced directives List of other physicians Hospitalizations, surgeries, and ER visits in previous 12 months Vitals Screenings to include cognitive, depression, and falls Referrals and appointments  In addition, I have reviewed and discussed with patient certain preventive protocols, quality  metrics, and best practice recommendations. A written personalized care plan for preventive services as well as general preventive health recommendations were provided to patient.     Daphane Shepherd, LPN   06/18/735   Nurse Notes: none

## 2022-02-15 NOTE — Patient Instructions (Signed)
Katherine Blake , Thank you for taking time to come for your Medicare Wellness Visit. I appreciate your ongoing commitment to your health goals. Please review the following plan we discussed and let me know if I can assist you in the future.   Screening recommendations/referrals: Colonoscopy: Cologuard 08/04/2019  due 07/2022 Mammogram: 12/23/2021 Bone Density: 12/30/2019 Recommended yearly ophthalmology/optometry visit for glaucoma screening and checkup Recommended yearly dental visit for hygiene and checkup  Vaccinations: Influenza vaccine: due in fall  Pneumococcal vaccine: completed  Tdap vaccine: 12/25/2017 Shingles vaccine: due 2nd dose  Covid-19:completed   Advanced directives: Please bring a copy of your health care power of attorney and living will to the office to be added to your chart at your convenience.   Conditions/risks identified: Aim for 30 minutes of exercise or brisk walking, 6-8 glasses of water, and 5 servings of fruits and vegetables each day.   Next appointment: Follow up in one year for your annual wellness visit    Preventive Care 65 Years and Older, Female Preventive care refers to lifestyle choices and visits with your health care provider that can promote health and wellness. What does preventive care include? A yearly physical exam. This is also called an annual well check. Dental exams once or twice a year. Routine eye exams. Ask your health care provider how often you should have your eyes checked. Personal lifestyle choices, including: Daily care of your teeth and gums. Regular physical activity. Eating a healthy diet. Avoiding tobacco and drug use. Limiting alcohol use. Practicing safe sex. Taking low-dose aspirin every day. Taking vitamin and mineral supplements as recommended by your health care provider. What happens during an annual well check? The services and screenings done by your health care provider during your annual well check will  depend on your age, overall health, lifestyle risk factors, and family history of disease. Counseling  Your health care provider may ask you questions about your: Alcohol use. Tobacco use. Drug use. Emotional well-being. Home and relationship well-being. Sexual activity. Eating habits. History of falls. Memory and ability to understand (cognition). Work and work Statistician. Reproductive health. Screening  You may have the following tests or measurements: Height, weight, and BMI. Blood pressure. Lipid and cholesterol levels. These may be checked every 5 years, or more frequently if you are over 12 years old. Skin check. Lung cancer screening. You may have this screening every year starting at age 1 if you have a 30-pack-year history of smoking and currently smoke or have quit within the past 15 years. Fecal occult blood test (FOBT) of the stool. You may have this test every year starting at age 63. Flexible sigmoidoscopy or colonoscopy. You may have a sigmoidoscopy every 5 years or a colonoscopy every 10 years starting at age 47. Hepatitis C blood test. Hepatitis B blood test. Sexually transmitted disease (STD) testing. Diabetes screening. This is done by checking your blood sugar (glucose) after you have not eaten for a while (fasting). You may have this done every 1-3 years. Bone density scan. This is done to screen for osteoporosis. You may have this done starting at age 46. Mammogram. This may be done every 1-2 years. Talk to your health care provider about how often you should have regular mammograms. Talk with your health care provider about your test results, treatment options, and if necessary, the need for more tests. Vaccines  Your health care provider may recommend certain vaccines, such as: Influenza vaccine. This is recommended every year. Tetanus, diphtheria, and  acellular pertussis (Tdap, Td) vaccine. You may need a Td booster every 10 years. Zoster vaccine. You may  need this after age 51. Pneumococcal 13-valent conjugate (PCV13) vaccine. One dose is recommended after age 65. Pneumococcal polysaccharide (PPSV23) vaccine. One dose is recommended after age 72. Talk to your health care provider about which screenings and vaccines you need and how often you need them. This information is not intended to replace advice given to you by your health care provider. Make sure you discuss any questions you have with your health care provider. Document Released: 06/25/2015 Document Revised: 02/16/2016 Document Reviewed: 03/30/2015 Elsevier Interactive Patient Education  2017 [place] Prevention in the Home Falls can cause injuries. They can happen to people of all ages. There are many things you can do to make your home safe and to help prevent falls. What can I do on the outside of my home? Regularly fix the edges of walkways and driveways and fix any cracks. Remove anything that might make you trip as you walk through a door, such as a raised step or threshold. Trim any bushes or trees on the path to your home. Use bright outdoor lighting. Clear any walking paths of anything that might make someone trip, such as rocks or tools. Regularly check to see if handrails are loose or broken. Make sure that both sides of any steps have handrails. Any raised decks and porches should have guardrails on the edges. Have any leaves, snow, or ice cleared regularly. Use sand or salt on walking paths during winter. Clean up any spills in your garage right away. This includes oil or grease spills. What can I do in the bathroom? Use night lights. Install grab bars by the toilet and in the tub and shower. Do not use towel bars as grab bars. Use non-skid mats or decals in the tub or shower. If you need to sit down in the shower, use a plastic, non-slip stool. Keep the floor dry. Clean up any water that spills on the floor as soon as it happens. Remove soap buildup in  the tub or shower regularly. Attach bath mats securely with double-sided non-slip rug tape. Do not have throw rugs and other things on the floor that can make you trip. What can I do in the bedroom? Use night lights. Make sure that you have a light by your bed that is easy to reach. Do not use any sheets or blankets that are too big for your bed. They should not hang down onto the floor. Have a firm chair that has side arms. You can use this for support while you get dressed. Do not have throw rugs and other things on the floor that can make you trip. What can I do in the kitchen? Clean up any spills right away. Avoid walking on wet floors. Keep items that you use a lot in easy-to-reach places. If you need to reach something above you, use a strong step stool that has a grab bar. Keep electrical cords out of the way. Do not use floor polish or wax that makes floors slippery. If you must use wax, use non-skid floor wax. Do not have throw rugs and other things on the floor that can make you trip. What can I do with my stairs? Do not leave any items on the stairs. Make sure that there are handrails on both sides of the stairs and use them. Fix handrails that are broken or loose. Make sure that  handrails are as long as the stairways. Check any carpeting to make sure that it is firmly attached to the stairs. Fix any carpet that is loose or worn. Avoid having throw rugs at the top or bottom of the stairs. If you do have throw rugs, attach them to the floor with carpet tape. Make sure that you have a light switch at the top of the stairs and the bottom of the stairs. If you do not have them, ask someone to add them for you. What else can I do to help prevent falls? Wear shoes that: Do not have high heels. Have rubber bottoms. Are comfortable and fit you well. Are closed at the toe. Do not wear sandals. If you use a stepladder: Make sure that it is fully opened. Do not climb a closed  stepladder. Make sure that both sides of the stepladder are locked into place. Ask someone to hold it for you, if possible. Clearly mark and make sure that you can see: Any grab bars or handrails. First and last steps. Where the edge of each step is. Use tools that help you move around (mobility aids) if they are needed. These include: Canes. Walkers. Scooters. Crutches. Turn on the lights when you go into a dark area. Replace any light bulbs as soon as they burn out. Set up your furniture so you have a clear path. Avoid moving your furniture around. If any of your floors are uneven, fix them. If there are any pets around you, be aware of where they are. Review your medicines with your doctor. Some medicines can make you feel dizzy. This can increase your chance of falling. Ask your doctor what other things that you can do to help prevent falls. This information is not intended to replace advice given to you by your health care provider. Make sure you discuss any questions you have with your health care provider. Document Released: 03/25/2009 Document Revised: 11/04/2015 Document Reviewed: 07/03/2014 Elsevier Interactive Patient Education  2017 [employer].

## 2022-03-07 DIAGNOSIS — D044 Carcinoma in situ of skin of scalp and neck: Secondary | ICD-10-CM | POA: Diagnosis not present

## 2022-04-10 ENCOUNTER — Ambulatory Visit: Payer: Medicare Other | Admitting: Dermatology

## 2022-05-08 ENCOUNTER — Telehealth (INDEPENDENT_AMBULATORY_CARE_PROVIDER_SITE_OTHER): Payer: Medicare Other | Admitting: Family Medicine

## 2022-05-08 ENCOUNTER — Encounter: Payer: Self-pay | Admitting: Family Medicine

## 2022-05-08 DIAGNOSIS — U071 COVID-19: Secondary | ICD-10-CM

## 2022-05-08 MED ORDER — BENZONATATE 200 MG PO CAPS: 200.0000 mg | ORAL_CAPSULE | Freq: Three times a day (TID) | ORAL | 0 refills | Status: DC | PRN

## 2022-05-08 MED ORDER — GUAIFENESIN-CODEINE 100-10 MG/5ML PO SYRP: 10.0000 mL | ORAL_SOLUTION | Freq: Three times a day (TID) | ORAL | 0 refills | Status: DC | PRN

## 2022-05-08 NOTE — Patient Instructions (Signed)
Follow up as needed or as scheduled USE the cough pills (benzonatate) as needed for daytime cough USE the cough syrup for nights or when drowsiness won't be a problem REST! Drink LOTS of fluids Vit D, Vit C, and Zinc are all immune system boosters (Vit C and Zinc are often found together in products like AirBorne and Coldeeze) Alternate Tylenol and ibuprofen to help w/ pain and headache Use Mucinex to thin your congestion and phenylephrine as needed for decongestant Call with any questions or concerns Hang in there!

## 2022-05-08 NOTE — Progress Notes (Signed)
   Virtual Visit via Video   I connected with Katherine Blake on 05/08/22 at  2:40 PM EST by a video enabled telemedicine application and verified that I am speaking with the correct person using two identifiers.  Location Katherine Blake: Home Location provider: [facility], Office Persons participating in the virtual visit: Katherine Blake, Provider, [place] Marcille Blanco C)  I discussed the limitations of evaluation and management by telemedicine and the availability of in person appointments. The Katherine Blake expressed understanding and agreed to proceed.  Subjective:   HPI:   COVID- pt reports she was on vacation last week and has since developed HA, 'terrible congestion, horrible cough'.  Sxs started 6 days ago w/ extreme fatigue, followed by sore throat.  No fevers.  No SOB.  Initially had body aches but now just sore from coughing.  No known sick contacts- 'i was the first'.  Pt tested + for COVID today.  ROS:   See pertinent positives and negatives per HPI.  Katherine Blake Active Problem List   Diagnosis Date Noted   Physical exam 01/02/2022   History of total abdominal hysterectomy and bilateral salpingo-oophorectomy 12/21/2020   Primary osteoarthritis of first carpometacarpal joint of left hand 10/14/2019   Hyperlipidemia 02/22/2017   Overweight (BMI 25.0-29.9) 02/22/2017    Social History   Tobacco Use   Smoking status: Never   Smokeless tobacco: Never  Substance Use Topics   Alcohol use: Yes    Alcohol/week: 2.0 standard drinks of alcohol    Types: 2 Standard drinks or equivalent per week    Current Outpatient Medications:    benzonatate (TESSALON) 200 MG capsule, Take 1 capsule (200 mg total) by mouth 3 (three) times daily as needed for cough., Disp: 60 capsule, Rfl: 0   Calcium Carbonate-Vitamin D (CALCIUM + D PO), Take 1 tablet by mouth daily. , Disp: , Rfl:    Cardiac Defibrillators KIT, Only to use in emergencies- unresponsiveness or cardiac distress, Disp: 1 kit, Rfl: 0   cetirizine  (ZYRTEC) 10 MG tablet, Take 1 tablet (10 mg total) by mouth daily., Disp: 30 tablet, Rfl: 11   fluticasone (FLONASE) 50 MCG/ACT nasal spray, Place 1 spray into both nostrils every evening. , Disp: , Rfl:    guaiFENesin-codeine (ROBITUSSIN AC) 100-10 MG/5ML syrup, Take 10 mLs by mouth 3 (three) times daily as needed for cough., Disp: 180 mL, Rfl: 0   metoprolol succinate (TOPROL XL) 25 MG 24 hr tablet, Take 0.5 tablets (12.5 mg total) by mouth daily., Disp: 45 tablet, Rfl: 3   Red Yeast Rice Extract (RED YEAST RICE PO), Take by mouth., Disp: , Rfl:   Allergies  Allergen Reactions   Amoxicillin Diarrhea   Cephalexin Rash    Objective:   There were no vitals taken for this visit. AAOx3, NAD NCAT, EOMI No obvious CN deficits Coloring WNL Pt is able to speak clearly, coherently without shortness of breath or increased work of breathing.  Thought process is linear.  Mood is appropriate.   Assessment and Plan:   COVID- new.  Unfortunately pt is outside of the antiviral window.  Thankfully some of the original sxs have already improved- sore throat, body aches.  Currently cough and congestion are her biggest complaint.  Will start Tessalon for daytime cough and cheratussin for nights.  Reviewed symptomatic and supportive care w/ pt.  Also discussed red flags that should prompt immediate return.  Pt expressed understanding and is in agreement w/ plan.    Annye Asa, MD 05/08/2022

## 2022-05-11 ENCOUNTER — Other Ambulatory Visit: Payer: Self-pay

## 2022-05-11 DIAGNOSIS — N952 Postmenopausal atrophic vaginitis: Secondary | ICD-10-CM

## 2022-05-11 NOTE — Telephone Encounter (Signed)
Yes please send refills until she is due for medication follow up in July 2024. Thanks.

## 2022-05-11 NOTE — Telephone Encounter (Signed)
Last AEX 12/26/2021--nothing scheduled yet for 2024. Last mammo 12/23/2021-WNL  OK to call refills until next visit is due (pt is low risk medicare but should still have office visit in 12/2022)?

## 2022-05-12 ENCOUNTER — Encounter: Payer: Self-pay | Admitting: Family Medicine

## 2022-05-12 MED ORDER — NONFORMULARY OR COMPOUNDED ITEM: 2 refills | Status: DC

## 2022-05-12 NOTE — Telephone Encounter (Signed)
Rx left in pharmacy vm.

## 2022-05-23 ENCOUNTER — Encounter: Payer: Self-pay | Admitting: Family Medicine

## 2022-05-30 DIAGNOSIS — D044 Carcinoma in situ of skin of scalp and neck: Secondary | ICD-10-CM | POA: Diagnosis not present

## 2022-07-11 ENCOUNTER — Other Ambulatory Visit: Payer: Self-pay | Admitting: Cardiovascular Disease

## 2022-08-04 ENCOUNTER — Other Ambulatory Visit: Payer: Self-pay | Admitting: Cardiovascular Disease

## 2022-08-28 ENCOUNTER — Other Ambulatory Visit: Payer: Self-pay | Admitting: Cardiovascular Disease

## 2022-08-28 ENCOUNTER — Encounter: Payer: Self-pay | Admitting: Family Medicine

## 2022-08-29 ENCOUNTER — Telehealth: Payer: Self-pay | Admitting: Cardiovascular Disease

## 2022-08-29 MED ORDER — METOPROLOL SUCCINATE ER 25 MG PO TB24: 12.5000 mg | ORAL_TABLET | Freq: Every day | ORAL | 1 refills | Status: DC

## 2022-08-29 NOTE — Telephone Encounter (Signed)
Pt's medication was sent to pt's pharmacy as requested. Confirmation received.  °

## 2022-08-29 NOTE — Telephone Encounter (Signed)
*  STAT* If patient is at the pharmacy, call can be transferred to refill team.   1. Which medications need to be refilled? (please list name of each medication and dose if known)   metoprolol succinate (TOPROL-XL) 25 MG 24 hr tablet   2. Which pharmacy/location (including street and city if local pharmacy) is medication to be sent to?  [id number] - [place], [place] - [address]   3. Do they need a 30 day or 90 day supply?  90 day  Patient states she still has some of this medication.  Patient has appointment on 5/3.

## 2022-08-31 ENCOUNTER — Encounter: Payer: Self-pay | Admitting: Family Medicine

## 2022-08-31 DIAGNOSIS — Z1211 Encounter for screening for malignant neoplasm of colon: Secondary | ICD-10-CM

## 2022-09-11 DIAGNOSIS — K080 Exfoliation of teeth due to systemic causes: Secondary | ICD-10-CM | POA: Diagnosis not present

## 2022-09-11 DIAGNOSIS — Z1211 Encounter for screening for malignant neoplasm of colon: Secondary | ICD-10-CM | POA: Diagnosis not present

## 2022-09-15 LAB — COLOGUARD: COLOGUARD: NEGATIVE

## 2022-09-18 ENCOUNTER — Telehealth: Payer: Self-pay

## 2022-09-18 NOTE — Telephone Encounter (Signed)
Pt seen results Via my chart  

## 2022-09-18 NOTE — Telephone Encounter (Signed)
-----   Message from Sheliah Hatch, MD sent at 09/17/2022  3:12 PM EDT ----- Normal cologuard- great news!!

## 2022-09-20 DIAGNOSIS — L918 Other hypertrophic disorders of the skin: Secondary | ICD-10-CM | POA: Diagnosis not present

## 2022-09-20 DIAGNOSIS — C44319 Basal cell carcinoma of skin of other parts of face: Secondary | ICD-10-CM | POA: Diagnosis not present

## 2022-09-20 DIAGNOSIS — L570 Actinic keratosis: Secondary | ICD-10-CM | POA: Diagnosis not present

## 2022-09-20 DIAGNOSIS — D485 Neoplasm of uncertain behavior of skin: Secondary | ICD-10-CM | POA: Diagnosis not present

## 2022-09-20 DIAGNOSIS — D044 Carcinoma in situ of skin of scalp and neck: Secondary | ICD-10-CM | POA: Diagnosis not present

## 2022-09-22 ENCOUNTER — Other Ambulatory Visit: Payer: Self-pay | Admitting: Cardiovascular Disease

## 2022-10-13 ENCOUNTER — Ambulatory Visit: Payer: Medicare Other | Admitting: Nurse Practitioner

## 2022-10-23 DIAGNOSIS — C44319 Basal cell carcinoma of skin of other parts of face: Secondary | ICD-10-CM | POA: Diagnosis not present
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
## 2022-11-07 ENCOUNTER — Ambulatory Visit: Payer: Medicare Other | Attending: Nurse Practitioner | Admitting: Nurse Practitioner

## 2022-11-07 ENCOUNTER — Other Ambulatory Visit: Payer: Self-pay | Admitting: Cardiovascular Disease

## 2022-11-07 ENCOUNTER — Encounter: Payer: Self-pay | Admitting: Nurse Practitioner

## 2022-11-07 VITALS — BP 112/70 | HR 76 | Ht 62.5 in | Wt 144.6 lb

## 2022-11-07 DIAGNOSIS — I471 Supraventricular tachycardia, unspecified: Secondary | ICD-10-CM | POA: Diagnosis not present

## 2022-11-07 DIAGNOSIS — R002 Palpitations: Secondary | ICD-10-CM

## 2022-11-07 DIAGNOSIS — Z9189 Other specified personal risk factors, not elsewhere classified: Secondary | ICD-10-CM

## 2022-11-07 DIAGNOSIS — E7800 Pure hypercholesterolemia, unspecified: Secondary | ICD-10-CM | POA: Diagnosis not present

## 2022-11-07 MED ORDER — ROSUVASTATIN CALCIUM 5 MG PO TABS: 5.0000 mg | ORAL_TABLET | Freq: Every evening | ORAL | 3 refills | Status: DC

## 2022-11-07 NOTE — Patient Instructions (Signed)
Medication Instructions:   START rosuvastatin one (1) tablet by mouth ( 5 mg) every evening.   *If you need a refill on your cardiac medications before your next appointment, please call your pharmacy*   Lab Work:  None ordered.  If you have labs (blood work) drawn today and your tests are completely normal, you will receive your results only by: MyChart Message (if you have MyChart) OR A paper copy in the mail If you have any lab test that is abnormal or we need to change your treatment, we will call you to review the results.   Testing/Procedures:  None ordered.   Follow-Up: At [facility], you and your health needs are our priority.  As part of our continuing mission to provide you with exceptional heart care, we have created designated Provider Care Teams.  These Care Teams include your primary Cardiologist (physician) and Advanced Practice Providers (APPs -  Physician Assistants and Nurse Practitioners) who all work together to provide you with the care you need, when you need it.  We recommend signing up for the patient portal called "MyChart".  Sign up information is provided on this After Visit Summary.  MyChart is used to connect with patients for Virtual Visits (Telemedicine).  Patients are able to view lab/test results, encounter notes, upcoming appointments, etc.  Non-urgent messages can be sent to your provider as well.   To learn more about what you can do with MyChart, go to [email or web address].    Your next appointment:   1 year(s)  Provider:   Verne Carrow, MD    Other Instructions  Your physician wants you to follow-up in: 1 year with Dr. Clifton James.  You will receive a reminder letter in the mail two months in advance. If you don't receive a letter, please call our office to schedule the follow-up appointment.  Mediterranean Diet A Mediterranean diet refers to food and lifestyle choices that are based on the traditions of countries located  on the [employer]. It focuses on eating more fruits, vegetables, whole grains, beans, nuts, seeds, and heart-healthy fats, and eating less dairy, meat, eggs, and processed foods with added sugar, salt, and fat. This way of eating has been shown to help prevent certain conditions and improve outcomes for people who have chronic diseases, like kidney disease and heart disease. What are tips for following this plan? Reading food labels Check the serving size of packaged foods. For foods such as rice and pasta, the serving size refers to the amount of cooked product, not dry. Check the total fat in packaged foods. Avoid foods that have saturated fat or trans fats. Check the ingredient list for added sugars, such as corn syrup. Shopping  Buy a variety of foods that offer a balanced diet, including: Fresh fruits and vegetables (produce). Grains, beans, nuts, and seeds. Some of these may be available in unpackaged forms or large amounts (in bulk). Fresh seafood. Poultry and eggs. Low-fat dairy products. Buy whole ingredients instead of prepackaged foods. Buy fresh fruits and vegetables in-season from local farmers markets. Buy plain frozen fruits and vegetables. If you do not have access to quality fresh seafood, buy precooked frozen shrimp or canned fish, such as tuna, salmon, or sardines. Stock your pantry so you always have certain foods on hand, such as olive oil, canned tuna, canned tomatoes, rice, pasta, and beans. Cooking Cook foods with extra-virgin olive oil instead of using butter or other vegetable oils. Have meat as a side dish, and have  vegetables or grains as your main dish. This means having meat in small portions or adding small amounts of meat to foods like pasta or stew. Use beans or vegetables instead of meat in common dishes like chili or lasagna. Experiment with different cooking methods. Try roasting, broiling, steaming, and sauting vegetables. Add frozen vegetables to  soups, stews, pasta, or rice. Add nuts or seeds for added healthy fats and plant protein at each meal. You can add these to yogurt, salads, or vegetable dishes. Marinate fish or vegetables using olive oil, lemon juice, garlic, and fresh herbs. Meal planning Plan to eat one vegetarian meal one day each week. Try to work up to two vegetarian meals, if possible. Eat seafood two or more times a week. Have healthy snacks readily available, such as: Vegetable sticks with hummus. Greek yogurt. Fruit and nut trail mix. Eat balanced meals throughout the week. This includes: Fruit: 2-3 servings a day. Vegetables: 4-5 servings a day. Low-fat dairy: 2 servings a day. Fish, poultry, or lean meat: 1 serving a day. Beans and legumes: 2 or more servings a week. Nuts and seeds: 1-2 servings a day. Whole grains: 6-8 servings a day. Extra-virgin olive oil: 3-4 servings a day. Limit red meat and sweets to only a few servings a month. Lifestyle  Cook and eat meals together with your family, when possible. Drink enough fluid to keep your urine pale yellow. Be physically active every day. This includes: Aerobic exercise like running or swimming. Leisure activities like gardening, walking, or housework. Get 7-8 hours of sleep each night. If recommended by your health care provider, drink red wine in moderation. This means 1 glass a day for nonpregnant women and 2 glasses a day for men. A glass of wine equals 5 oz (150 mL). What foods should I eat? Fruits Apples. Apricots. Avocado. Berries. Bananas. Cherries. Dates. Figs. Grapes. Lemons. Melon. Oranges. Peaches. Plums. Pomegranate. Vegetables Artichokes. Beets. Broccoli. Cabbage. Carrots. Eggplant. Green beans. Chard. Kale. Spinach. Onions. Leeks. Peas. Squash. Tomatoes. Peppers. Radishes. Grains Whole-grain pasta. Brown rice. Bulgur wheat. Polenta. Couscous. Whole-wheat bread. Orpah Cobb. Meats and other proteins Beans. Almonds. Sunflower seeds.  Pine nuts. Peanuts. Cod. Salmon. Scallops. Shrimp. Tuna. Tilapia. Clams. Oysters. Eggs. Poultry without skin. Dairy Low-fat milk. Cheese. Greek yogurt. Fats and oils Extra-virgin olive oil. Avocado oil. Grapeseed oil. Beverages Water. Red wine. Herbal tea. Sweets and desserts Greek yogurt with honey. Baked apples. Poached pears. Trail mix. Seasonings and condiments Basil. Cilantro. Coriander. Cumin. Mint. Parsley. Sage. Rosemary. Tarragon. Garlic. Oregano. Thyme. Pepper. Balsamic vinegar. Tahini. Hummus. Tomato sauce. Olives. Mushrooms. The items listed above may not be a complete list of foods and beverages you can eat. Contact a dietitian for more information. What foods should I limit? This is a list of foods that should be eaten rarely or only on special occasions. Fruits Fruit canned in syrup. Vegetables Deep-fried potatoes (french fries). Grains Prepackaged pasta or rice dishes. Prepackaged cereal with added sugar. Prepackaged snacks with added sugar. Meats and other proteins Beef. Pork. Lamb. Poultry with skin. Hot dogs. Tomasa Blase. Dairy Ice cream. Sour cream. Whole milk. Fats and oils Butter. Canola oil. Vegetable oil. Beef fat (tallow). Lard. Beverages Juice. Sugar-sweetened soft drinks. Beer. Liquor and spirits. Sweets and desserts Cookies. Cakes. Pies. Candy. Seasonings and condiments Mayonnaise. Pre-made sauces and marinades. The items listed above may not be a complete list of foods and beverages you should limit. Contact a dietitian for more information. Summary The Mediterranean diet includes both food and lifestyle  choices. Eat a variety of fresh fruits and vegetables, beans, nuts, seeds, and whole grains. Limit the amount of red meat and sweets that you eat. If recommended by your health care provider, drink red wine in moderation. This means 1 glass a day for nonpregnant women and 2 glasses a day for men. A glass of wine equals 5 oz (150 mL). This information is  not intended to replace advice given to you by your health care provider. Make sure you discuss any questions you have with your health care provider. Document Revised: 07/04/2019 Document Reviewed: 05/01/2019 Elsevier Patient Education  2023 Else[employer]. Adopting a Healthy Lifestyle.   Weight: Know what a healthy weight is for you (roughly BMI <25) and aim to maintain this. You can calculate your body mass index on your smart phone  Diet: Aim for 7+ servings of fruits and vegetables daily Limit animal fats in diet for cholesterol and heart health - choose grass fed whenever available Avoid highly processed foods (fast food burgers, tacos, fried chicken, pizza, hot dogs, french fries)  Saturated fat comes in the form of butter, lard, coconut oil, margarine, partially hydrogenated oils, and fat in meat. These increase your risk of cardiovascular disease.  Use healthy plant oils, such as olive, canola, soy, corn, sunflower and peanut.  Whole foods such as fruits, vegetables and whole grains have fiber  Men need > 38 grams of fiber per day Women need > 25 grams of fiber per day  Load up on vegetables and fruits - one-half of your plate: Aim for color and variety, and remember that potatoes dont count. Go for whole grains - one-quarter of your plate: Whole wheat, barley, wheat berries, quinoa, oats, brown rice, and foods made with them. If you want pasta, go with whole wheat pasta. Protein power - one-quarter of your plate: Fish, chicken, beans, and nuts are all healthy, versatile protein sources. Limit red meat. You need carbohydrates for energy! The type of carbohydrate is more important than the amount. Choose carbohydrates such as vegetables, fruits, whole grains, beans, and nuts in the place of white rice, white pasta, potatoes (baked or fried), macaroni and cheese, cakes, cookies, and donuts.  If youre thirsty, drink water. Coffee and tea are good in moderation, but skip sugary drinks and  limit milk and dairy products to one or two daily servings. Keep sugar intake at 6 teaspoons or 24 grams or LESS       Exercise: Aim for 150 min of moderate intensity exercise weekly for heart health, and weights twice weekly for bone health Stay active - any steps are better than no steps! Aim for 7-9 hours of sleep daily

## 2022-11-08 ENCOUNTER — Encounter: Payer: Self-pay | Admitting: Family Medicine

## 2022-12-18 DIAGNOSIS — H31001 Unspecified chorioretinal scars, right eye: Secondary | ICD-10-CM | POA: Diagnosis not present

## 2022-12-18 DIAGNOSIS — H5211 Myopia, right eye: Secondary | ICD-10-CM | POA: Diagnosis not present

## 2022-12-20 DIAGNOSIS — Z85828 Personal history of other malignant neoplasm of skin: Secondary | ICD-10-CM | POA: Diagnosis not present

## 2022-12-20 DIAGNOSIS — L814 Other melanin hyperpigmentation: Secondary | ICD-10-CM | POA: Diagnosis not present

## 2022-12-20 DIAGNOSIS — L821 Other seborrheic keratosis: Secondary | ICD-10-CM | POA: Diagnosis not present

## 2022-12-20 DIAGNOSIS — L578 Other skin changes due to chronic exposure to nonionizing radiation: Secondary | ICD-10-CM | POA: Diagnosis not present

## 2023-01-05 DIAGNOSIS — Z1231 Encounter for screening mammogram for malignant neoplasm of breast: Secondary | ICD-10-CM | POA: Diagnosis not present

## 2023-01-08 ENCOUNTER — Encounter: Payer: Self-pay | Admitting: Family Medicine

## 2023-01-15 NOTE — Progress Notes (Unsigned)
   Katherine Blake July 06, 1945 [id number]   History:  77 y.o. G2P2002 presents for medication management. S/P 1987 TAH BSO for endometriosis. Using vaginal estrogen for dryness and painful intercourse. Normal pap and mammogram history.   Gynecologic History No LMP recorded. Patient has had a hysterectomy.   Contraception/Family planning: status post hysterectomy Sexually active: Yes  Health Maintenance Last Pap: 11/19/2018. Results were: Normal Last mammogram: 01/05/2023. Results were: Normal Last colonoscopy: 2012. Results were: Normal. Negative Cologuard 07/2019 Last Dexa: 12/30/2019. Results were: Normal  Past medical history, past surgical history, family history and social history were all reviewed and documented in the EPIC chart. Married. Son in [place] - has 2 and 5 yo. Daughter in [place] - has twin 22 yos, boy and girl.   ROS:  A ROS was performed and pertinent positives and negatives are included.  Exam:  Vitals:   01/16/23 0930  BP: 128/70  Pulse: 69  SpO2: 98%  Weight: 151 lb (68.5 kg)  Height: 5\' 1"  (1.549 m)     Body mass index is 28.53 kg/m.  General appearance:  Normal  Assessment/Plan:  77 y.o. [id number] for medication management.   Vaginal atrophy - Plan: NONFORMULARY OR COMPOUNDED ITEM. Plan: NONFORMULARY OR COMPOUNDED ITEM. Using compounded vaginal estrogen twice weekly with good management. She would like to continue. Refills provided.   Screening for colon cancer - Plan: Cologuard. Negative Cologuard 07/2019.   Screening for cervical cancer - Normal Pap history.  No longer screening per guidelines.   Screening for breast cancer - Normal mammogram history.  Continue annual screenings.  Normal breast exam today.  Screening for osteoporosis - Normal bone density 2021. Will repeat at 5-year interval.   Return in 1 year for breast and pelvic exam.     Olivia Mackie DNP, 9:57 AM 01/16/2023

## 2023-01-16 ENCOUNTER — Ambulatory Visit (INDEPENDENT_AMBULATORY_CARE_PROVIDER_SITE_OTHER): Payer: Medicare Other | Admitting: Nurse Practitioner

## 2023-01-16 ENCOUNTER — Encounter: Payer: Self-pay | Admitting: Nurse Practitioner

## 2023-01-16 VITALS — BP 128/70 | HR 69 | Ht 61.0 in | Wt 151.0 lb

## 2023-01-16 DIAGNOSIS — N952 Postmenopausal atrophic vaginitis: Secondary | ICD-10-CM | POA: Diagnosis not present

## 2023-01-16 DIAGNOSIS — Z1211 Encounter for screening for malignant neoplasm of colon: Secondary | ICD-10-CM

## 2023-01-16 MED ORDER — NONFORMULARY OR COMPOUNDED ITEM: 2 refills | Status: DC

## 2023-01-17 NOTE — Addendum Note (Signed)
Addended byWyline Beady on: 01/17/2023 09:17 AM   Modules accepted: Orders

## 2023-01-24 ENCOUNTER — Other Ambulatory Visit: Payer: Self-pay

## 2023-01-24 DIAGNOSIS — N952 Postmenopausal atrophic vaginitis: Secondary | ICD-10-CM

## 2023-01-24 MED ORDER — NONFORMULARY OR COMPOUNDED ITEM: 2 refills | Status: DC

## 2023-01-24 NOTE — Telephone Encounter (Signed)
Med refill request: Last AEX: 12/26/2021, Seen for 22yr med f/u on 01/16/2023 Next AEX: nothing scheduled now, recall placed for 01/2023 for next B&P Last MMG (if hormonal med): 01/05/2023 Refill authorized: rx pend.   FYI. Looks like refills were provided by you on 01/16/2023. However, they were printed and I cannot see any documentation that the rx was faxed to pharmacy. Ok to contact pharmacy by phone to inquire if received and if not, ok to provide verbal? TIA.

## 2023-01-24 NOTE — Telephone Encounter (Signed)
Spoke w/ pt about what happened with rx refills. Advised her that I will have this resolved before the end of the day today. She voiced understanding and appreciation for the call.  Spoke w/ [facility]. They reported that they did not receive the fax so verbal was provided for her cream. Rx documented in EMR.   Routing to provider for final review and closing encounter.

## 2023-01-24 NOTE — Telephone Encounter (Signed)
Yes, OK to follow up with pharmacy. Thanks.

## 2023-01-25 ENCOUNTER — Encounter (INDEPENDENT_AMBULATORY_CARE_PROVIDER_SITE_OTHER): Payer: Self-pay

## 2023-01-26 ENCOUNTER — Encounter: Payer: Self-pay | Admitting: Nurse Practitioner

## 2023-02-22 ENCOUNTER — Ambulatory Visit (INDEPENDENT_AMBULATORY_CARE_PROVIDER_SITE_OTHER): Payer: Medicare Other | Admitting: *Deleted

## 2023-02-22 DIAGNOSIS — Z0000 Encounter for general adult medical examination without abnormal findings: Secondary | ICD-10-CM | POA: Diagnosis not present

## 2023-02-22 NOTE — Progress Notes (Signed)
Subjective:   Katherine Blake is a 77 y.o. female who presents for Medicare Annual (Subsequent) preventive examination.  Visit Complete: Virtual  I connected with  Katherine Blake on 02/22/23 by a audio enabled telemedicine application and verified that I am speaking with the correct person using two identifiers.  Patient Location: Home  Provider Location: Home Office  I discussed the limitations of evaluation and management by telemedicine. The patient expressed understanding and agreed to proceed.  Vital Signs: Unable to obtain new vitals due to this being a telehealth visit.   Review of Systems     Cardiac Risk Factors include: advanced age (>5men, >45 women)     Objective:    There were no vitals filed for this visit. There is no height or weight on file to calculate BMI.     02/22/2023    8:04 AM 02/15/2022   11:13 AM 01/10/2021    9:07 AM 01/06/2020    9:47 AM 09/11/2019    6:57 PM 11/13/2018   10:09 AM 08/30/2017    8:19 AM  Advanced Directives  Does Patient Have a Medical Advance Directive? Yes Yes Yes Yes No Yes Yes  Type of Estate agent of [employer] Power of [place];Living will Healthcare Power of [place];Living will Healthcare Power of [place];Living will  Healthcare Power of [place];Living will Healthcare Power of [place];Living will  Copy of Healthcare Power of Attorney in Chart? Yes - validated most recent copy scanned in chart (See row information) No - copy requested Yes - validated most recent copy scanned in chart (See row information) No - copy requested  Yes - validated most recent copy scanned in chart (See row information) No - copy requested  Would patient like information on creating a medical advance directive?     No - Patient declined      Current Medications (verified) Outpatient Encounter Medications as of 02/22/2023  Medication Sig   Calcium Carbonate-Vitamin D (CALCIUM + D PO) Take 1 tablet by mouth daily.     Cardiac Defibrillators KIT Only to use in emergencies- unresponsiveness or cardiac distress   cetirizine (ZYRTEC) 10 MG tablet Take 1 tablet (10 mg total) by mouth daily.   fluticasone (FLONASE) 50 MCG/ACT nasal spray Place 1 spray into both nostrils every evening.    metoprolol succinate (TOPROL-XL) 25 MG 24 hr tablet TAKE 1/2 TABLET BY MOUTH DAILY   NONFORMULARY OR COMPOUNDED ITEM Estradiol 0.02% Vaginal Cream, Insert 1gm into vagina twice weekly.   rosuvastatin (CRESTOR) 5 MG tablet Take 1 tablet (5 mg total) by mouth every evening.   No facility-administered encounter medications on file as of 02/22/2023.    Allergies (verified) Amoxicillin and Cephalexin   History: Past Medical History:  Diagnosis Date   Allergy    History of chicken pox    SCC (squamous cell carcinoma) 04/10/2016   left scalp cx3 25fu   SCC (squamous cell carcinoma) 04/10/2016   left forehaed cx3 62fu   Squamous cell carcinoma of skin 05/09/2005   top scalp cx3 59fu   Past Surgical History:  Procedure Laterality Date   ABDOMINAL HYSTERECTOMY  1987   TAH,BSO Endometriosis   CESAREAN SECTION     X 2   OOPHORECTOMY     BSO   PELVIC LAPAROSCOPY  1980   Family History  Problem Relation Age of Onset   Dementia Mother    Thrombocytopenia Mother    Other Mother        JAK2 positive  Heart failure Father    Cancer Maternal Grandmother        Leukemia   Social History   Socioeconomic History   Marital status: Married    Spouse name: Not on file   Number of children: 2   Years of education: Not on file   Highest education level: Not on file  Occupational History   Occupation: REtired   Occupation: Presenter, broadcasting  Tobacco Use   Smoking status: Never   Smokeless tobacco: Never  Vaping Use   Vaping status: Never Used  Substance and Sexual Activity   Alcohol use: Yes    Alcohol/week: 2.0 standard drinks of alcohol    Types: 2 Standard drinks or equivalent per week   Drug use: Never   Sexual  activity: Yes    Birth control/protection: Surgical    Comment: first intercourse >18, less than 5 partners  Other Topics Concern   Not on file  Social History Narrative   Not on file   Social Determinants of Health   Financial Resource Strain: Low Risk  (02/22/2023)   Overall Financial Resource Strain (CARDIA)    Difficulty of Paying Living Expenses: Not very hard  Food Insecurity: No Food Insecurity (02/22/2023)   Hunger Vital Sign    Worried About Running Out of Food in the Last Year: Never true    Ran Out of Food in the Last Year: Never true  Transportation Needs: No Transportation Needs (02/22/2023)   PRAPARE - Administrator, Civil Service (Medical): No    Lack of Transportation (Non-Medical): No  Physical Activity: Sufficiently Active (02/22/2023)   Exercise Vital Sign    Days of Exercise per Week: 4 days    Minutes of Exercise per Session: 40 min  Stress: No Stress Concern Present (02/22/2023)   [employer] of Occupational Health - Occupational Stress Questionnaire    Feeling of Stress : Not at all  Social Connections: Socially Integrated (02/22/2023)   Social Connection and Isolation Panel [NHANES]    Frequency of Communication with Friends and Family: More than three times a week    Frequency of Social Gatherings with Friends and Family: Three times a week    Attends Religious Services: More than 4 times per year    Active Member of Clubs or Organizations: Yes    Attends Engineer, structural: More than 4 times per year    Marital Status: Married    Tobacco Counseling Counseling given: Not Answered   Clinical Intake:  Pre-visit preparation completed: Yes  Pain : No/denies pain     Diabetes: No     Interpreter Needed?: No  Information entered by :: Remi Haggard LPN   Activities of Daily Living    02/22/2023    8:07 AM  In your present state of health, do you have any difficulty performing the following activities:  Hearing? 0   Vision? 0  Difficulty concentrating or making decisions? 0  Walking or climbing stairs? 0  Dressing or bathing? 0  Doing errands, shopping? 0  Preparing Food and eating ? N  Using the Toilet? N  In the past six months, have you accidently leaked urine? Y  Do you have problems with loss of bowel control? N  Managing your Medications? N  Managing your Finances? N  Housekeeping or managing your Housekeeping? N    Patient Care Team: Sheliah Hatch, MD as PCP - General (Family Medicine) Kathleene Hazel, MD as PCP - Cardiology (Cardiology) Irena Cords,  Enzo Montgomery, MD as Consulting Physician (Allergy and Immunology) Charna Elizabeth, MD as Consulting Physician (Gastroenterology) Janalyn Harder, MD (Inactive) as Consulting Physician (Dermatology) Delfin Gant, MD as Attending Physician (Family Medicine) Sharma Covert (Dentistry) Olivia Mackie, NP as Nurse Practitioner (Gynecology)  Indicate any recent Medical Services you may have received from other than [facility] providers in the past year (date may be approximate).     Assessment:   This is a routine wellness examination for [place].  Hearing/Vision screen Hearing Screening - Comments:: No trouble hearing  Vision Screening - Comments:: Up to date Emily Filbert   Goals Addressed             This Visit's Progress    Weight (lb) < 200 lb (90.7 kg)         Depression Screen    02/22/2023    8:09 AM 05/08/2022    2:31 PM 02/15/2022   11:12 AM 01/02/2022    9:01 AM 08/22/2021    9:33 AM 01/10/2021    9:22 AM 11/29/2020    2:29 PM  PHQ 2/9 Scores  PHQ - 2 Score 0 0 0 0 0 0 0  PHQ- 9 Score 0 0 0 0 0  0    Fall Risk    02/22/2023    8:04 AM 05/08/2022    2:31 PM 02/15/2022   11:07 AM 01/02/2022    9:01 AM 08/22/2021    9:33 AM  Fall Risk   Falls in the past year? 0 0 0 0 0  Number falls in past yr: 0  0 0   Injury with Fall? 0  0 0   Risk for fall due to :  No Fall Risks No Fall Risks No Fall Risks No Fall Risks  Follow up  Education provided;Falls evaluation completed;Falls prevention discussed Falls evaluation completed Falls prevention discussed Falls evaluation completed Falls evaluation completed    MEDICARE RISK AT HOME: Medicare Risk at Home Any stairs in or around the home?: No If so, are there any without handrails?: Yes Home free of loose throw rugs in walkways, pet beds, electrical cords, etc?: Yes Adequate lighting in your home to reduce risk of falls?: No Life alert?: No Use of a cane, walker or w/c?: No Grab bars in the bathroom?: Yes Shower chair or bench in shower?: Yes Elevated toilet seat or a handicapped toilet?: Yes  TIMED UP AND GO:  Was the test performed?  No    Cognitive Function:    11/13/2018   10:11 AM  MMSE - Mini Mental State Exam  Orientation to time 5  Orientation to Place 5  Registration 3  Attention/ Calculation 5  Recall 1  Language- name 2 objects 2  Language- repeat 1  Language- follow 3 step command 3  Language- read & follow direction 1  Write a sentence 1  Copy design 1  Total score 28        02/22/2023    8:11 AM 02/15/2022   11:14 AM 01/06/2020    9:56 AM  6CIT Screen  What Year? 0 points 0 points 0 points  What month? 0 points 0 points 0 points  What time? 0 points 0 points 0 points  Count back from 20 0 points 0 points 0 points  Months in reverse 0 points 0 points 0 points  Repeat phrase 0 points 0 points 0 points  Total Score 0 points 0 points 0 points    Immunizations Immunization History  Administered Date(s) Administered  Fluad Quad(high Dose 65+) 02/17/2020, 02/15/2022   Influenza, High Dose Seasonal PF 02/12/2015, 03/02/2019   Influenza,inj,Quad PF,6+ Mos 03/13/2016, 02/22/2017, 02/01/2018   Influenza-Unspecified 03/13/2016, 01/10/2021, 02/15/2022   PFIZER SARS-COV-2 Pediatric Vaccination 5-54yrs 09/21/2020   PFIZER(Purple Top)SARS-COV-2 Vaccination 07/03/2019, 07/22/2019, 03/15/2020   Pneumococcal Conjugate-13 12/04/2013    Pneumococcal Polysaccharide-23 08/30/2017   Tdap 12/25/2017   Zoster Recombinant(Shingrix) 10/18/2010   Zoster, Live 10/18/2010    TDAP status: Up to date  Flu Vaccine status: Due, Education has been provided regarding the importance of this vaccine. Advised may receive this vaccine at local pharmacy or Health Dept. Aware to provide a copy of the vaccination record if obtained from local pharmacy or Health Dept. Verbalized acceptance and understanding.  Pneumococcal vaccine status: Up to date  Covid-19 vaccine status: Information provided on how to obtain vaccines.   Qualifies for Shingles Vaccine? Yes   Zostavax completed Yes   Shingrix Completed?: No.    Education has been provided regarding the importance of this vaccine. Patient has been advised to call insurance company to determine out of pocket expense if they have not yet received this vaccine. Advised may also receive vaccine at local pharmacy or Health Dept. Verbalized acceptance and understanding.  Screening Tests Health Maintenance  Topic Date Due   INFLUENZA VACCINE  09/10/2023 (Originally 01/11/2023)   MAMMOGRAM  01/05/2024   Medicare Annual Wellness (AWV)  02/22/2024   Fecal DNA (Cologuard)  09/10/2025   DTaP/Tdap/Td (2 - Td or Tdap) 12/26/2027   Pneumonia Vaccine 7+ Years old  Completed   DEXA SCAN  Completed   HPV VACCINES  Aged Out   Colonoscopy  Discontinued   COVID-19 Vaccine  Discontinued   Hepatitis C Screening  Discontinued   Zoster Vaccines- Shingrix  Discontinued    Health Maintenance  There are no preventive care reminders to display for this patient.   Colorectal cancer screening: Type of screening: Cologuard. Completed 2027. Repeat every 3 years  Mammogram status: Completed 2024. Repeat every year  Bone Density status: Completed 2021. Results reflect: Bone density results: NORMAL. Repeat every 2-5 years.  Lung Cancer Screening: (Low Dose CT Chest recommended if Age 34-80 years, 20 pack-year  currently smoking OR have quit w/in 15years.) does not qualify.   Lung Cancer Screening Referral:   Additional Screening:  Hepatitis C Screening: does qualify; declined  Vision Screening: Recommended annual ophthalmology exams for early detection of glaucoma and other disorders of the eye. Is the patient up to date with their annual eye exam?  Yes  Who is the provider or what is the name of the office in which the patient attends annual eye exams? [facility] If pt is not established with a provider, would they like to be referred to a provider to establish care? No .   Dental Screening: Recommended annual dental exams for proper oral hygiene    Community Resource Referral / Chronic Care Management: CRR required this visit?  No   CCM required this visit?  No     Plan:     I have personally reviewed and noted the following in the patient's chart:   Medical and social history Use of alcohol, tobacco or illicit drugs  Current medications and supplements including opioid prescriptions. Patient is not currently taking opioid prescriptions. Functional ability and status Nutritional status Physical activity Advanced directives List of other physicians Hospitalizations, surgeries, and ER visits in previous 12 months Vitals Screenings to include cognitive, depression, and falls Referrals and appointments  In addition, I  have reviewed and discussed with patient certain preventive protocols, quality metrics, and best practice recommendations. A written personalized care plan for preventive services as well as general preventive health recommendations were provided to patient.     Remi Haggard, LPN   1/61/0960   After Visit Summary: (MyChart) Due to this being a telephonic visit, the after visit summary with patients personalized plan was offered to patient via MyChart   Nurse Notes:

## 2023-02-22 NOTE — Patient Instructions (Signed)
Katherine Blake , Thank you for taking time to come for your Medicare Wellness Visit. I appreciate your ongoing commitment to your health goals. Please review the following plan we discussed and let me know if I can assist you in the future.   Screening recommendations/referrals: Colonoscopy: up to date Mammogram: up to date Bone Density: up to date Recommended yearly ophthalmology/optometry visit for glaucoma screening and checkup Recommended yearly dental visit for hygiene and checkup  Vaccinations: Influenza vaccine: Education provided Pneumococcal vaccine: up to date Tdap vaccine: up to date Shingles vaccine: Education provided    Advanced directives: yes on file    Preventive Care 75 Years and Older, Female Preventive care refers to lifestyle choices and visits with your health care provider that can promote health and wellness. What does preventive care include? A yearly physical exam. This is also called an annual well check. Dental exams once or twice a year. Routine eye exams. Ask your health care provider how often you should have your eyes checked. Personal lifestyle choices, including: Daily care of your teeth and gums. Regular physical activity. Eating a healthy diet. Avoiding tobacco and drug use. Limiting alcohol use. Practicing safe sex. Taking low-dose aspirin every day. Taking vitamin and mineral supplements as recommended by your health care provider. What happens during an annual well check? The services and screenings done by your health care provider during your annual well check will depend on your age, overall health, lifestyle risk factors, and family history of disease. Counseling  Your health care provider may ask you questions about your: Alcohol use. Tobacco use. Drug use. Emotional well-being. Home and relationship well-being. Sexual activity. Eating habits. History of falls. Memory and ability to understand (cognition). Work and work  Astronomer. Reproductive health. Screening  You may have the following tests or measurements: Height, weight, and BMI. Blood pressure. Lipid and cholesterol levels. These may be checked every 5 years, or more frequently if you are over 60 years old. Skin check. Lung cancer screening. You may have this screening every year starting at age 68 if you have a 30-pack-year history of smoking and currently smoke or have quit within the past 15 years. Fecal occult blood test (FOBT) of the stool. You may have this test every year starting at age 53. Flexible sigmoidoscopy or colonoscopy. You may have a sigmoidoscopy every 5 years or a colonoscopy every 10 years starting at age 38. Hepatitis C blood test. Hepatitis B blood test. Sexually transmitted disease (STD) testing. Diabetes screening. This is done by checking your blood sugar (glucose) after you have not eaten for a while (fasting). You may have this done every 1-3 years. Bone density scan. This is done to screen for osteoporosis. You may have this done starting at age 78. Mammogram. This may be done every 1-2 years. Talk to your health care provider about how often you should have regular mammograms. Talk with your health care provider about your test results, treatment options, and if necessary, the need for more tests. Vaccines  Your health care provider may recommend certain vaccines, such as: Influenza vaccine. This is recommended every year. Tetanus, diphtheria, and acellular pertussis (Tdap, Td) vaccine. You may need a Td booster every 10 years. Zoster vaccine. You may need this after age 31. Pneumococcal 13-valent conjugate (PCV13) vaccine. One dose is recommended after age 80. Pneumococcal polysaccharide (PPSV23) vaccine. One dose is recommended after age 24. Talk to your health care provider about which screenings and vaccines you need and how often  you need them. This information is not intended to replace advice given to you by  your health care provider. Make sure you discuss any questions you have with your health care provider. Document Released: 06/25/2015 Document Revised: 02/16/2016 Document Reviewed: 03/30/2015 Elsevier Interactive Patient Education  2017 [employer].  Fall Prevention in the Home Falls can cause injuries. They can happen to people of all ages. There are many things you can do to make your home safe and to help prevent falls. What can I do on the outside of my home? Regularly fix the edges of walkways and driveways and fix any cracks. Remove anything that might make you trip as you walk through a door, such as a raised step or threshold. Trim any bushes or trees on the path to your home. Use bright outdoor lighting. Clear any walking paths of anything that might make someone trip, such as rocks or tools. Regularly check to see if handrails are loose or broken. Make sure that both sides of any steps have handrails. Any raised decks and porches should have guardrails on the edges. Have any leaves, snow, or ice cleared regularly. Use sand or salt on walking paths during winter. Clean up any spills in your garage right away. This includes oil or grease spills. What can I do in the bathroom? Use night lights. Install grab bars by the toilet and in the tub and shower. Do not use towel bars as grab bars. Use non-skid mats or decals in the tub or shower. If you need to sit down in the shower, use a plastic, non-slip stool. Keep the floor dry. Clean up any water that spills on the floor as soon as it happens. Remove soap buildup in the tub or shower regularly. Attach bath mats securely with double-sided non-slip rug tape. Do not have throw rugs and other things on the floor that can make you trip. What can I do in the bedroom? Use night lights. Make sure that you have a light by your bed that is easy to reach. Do not use any sheets or blankets that are too big for your bed. They should not hang  down onto the floor. Have a firm chair that has side arms. You can use this for support while you get dressed. Do not have throw rugs and other things on the floor that can make you trip. What can I do in the kitchen? Clean up any spills right away. Avoid walking on wet floors. Keep items that you use a lot in easy-to-reach places. If you need to reach something above you, use a strong step stool that has a grab bar. Keep electrical cords out of the way. Do not use floor polish or wax that makes floors slippery. If you must use wax, use non-skid floor wax. Do not have throw rugs and other things on the floor that can make you trip. What can I do with my stairs? Do not leave any items on the stairs. Make sure that there are handrails on both sides of the stairs and use them. Fix handrails that are broken or loose. Make sure that handrails are as long as the stairways. Check any carpeting to make sure that it is firmly attached to the stairs. Fix any carpet that is loose or worn. Avoid having throw rugs at the top or bottom of the stairs. If you do have throw rugs, attach them to the floor with carpet tape. Make sure that you have a light  switch at the top of the stairs and the bottom of the stairs. If you do not have them, ask someone to add them for you. What else can I do to help prevent falls? Wear shoes that: Do not have high heels. Have rubber bottoms. Are comfortable and fit you well. Are closed at the toe. Do not wear sandals. If you use a stepladder: Make sure that it is fully opened. Do not climb a closed stepladder. Make sure that both sides of the stepladder are locked into place. Ask someone to hold it for you, if possible. Clearly mark and make sure that you can see: Any grab bars or handrails. First and last steps. Where the edge of each step is. Use tools that help you move around (mobility aids) if they are needed. These  include: Canes. Walkers. Scooters. Crutches. Turn on the lights when you go into a dark area. Replace any light bulbs as soon as they burn out. Set up your furniture so you have a clear path. Avoid moving your furniture around. If any of your floors are uneven, fix them. If there are any pets around you, be aware of where they are. Review your medicines with your doctor. Some medicines can make you feel dizzy. This can increase your chance of falling. Ask your doctor what other things that you can do to help prevent falls. This information is not intended to replace advice given to you by your health care provider. Make sure you discuss any questions you have with your health care provider. Document Released: 03/25/2009 Document Revised: 11/04/2015 Document Reviewed: 07/03/2014 Elsevier Interactive Patient Education  2017 [employer].

## 2023-03-12 ENCOUNTER — Encounter: Payer: Self-pay | Admitting: Family Medicine

## 2023-03-28 ENCOUNTER — Encounter: Payer: Self-pay | Admitting: Family Medicine

## 2023-04-02 DIAGNOSIS — K080 Exfoliation of teeth due to systemic causes: Secondary | ICD-10-CM | POA: Diagnosis not present

## 2023-04-19 ENCOUNTER — Ambulatory Visit (INDEPENDENT_AMBULATORY_CARE_PROVIDER_SITE_OTHER): Payer: Medicare Other | Admitting: Family Medicine

## 2023-04-19 ENCOUNTER — Encounter: Payer: Self-pay | Admitting: Family Medicine

## 2023-04-19 VITALS — BP 108/64 | HR 67 | Temp 97.7°F | Ht 61.0 in | Wt 151.2 lb

## 2023-04-19 DIAGNOSIS — M79671 Pain in right foot: Secondary | ICD-10-CM | POA: Diagnosis not present

## 2023-04-19 DIAGNOSIS — G629 Polyneuropathy, unspecified: Secondary | ICD-10-CM

## 2023-04-19 NOTE — Patient Instructions (Signed)
Follow up as needed or as scheduled Try shoes w/ a wider toe box to avoid compressing the metatarsals IF the neuropathy worsens and you want to talk medication- let me know Call with any questions or concerns Stay Safe!  Stay Healthy! Happy Fall!!

## 2023-04-19 NOTE — Progress Notes (Signed)
   Subjective:    Patient ID: Katherine Blake, female    DOB: July 22, 1945, 77 y.o.   MRN: [id number]  HPI Foot pain- pt reports 'sharp, stinging, burning pain'.  Pt feels this is neuropathy b/c mom had similar.  Sxs predominately R foot.  Started a few months ago.  Just recently developed a squeezing pain in R foot when she switched from summer sandals to shoes.  Squeezing pain is on the lateral side of R foot.  No swelling or redness.  No palpable lumps or bumps.     Review of Systems For ROS see HPI     Objective:   Physical Exam Vitals reviewed.  Constitutional:      General: She is not in acute distress.    Appearance: Normal appearance. She is not ill-appearing.  HENT:     Head: Normocephalic and atraumatic.  Cardiovascular:     Pulses: Normal pulses.  Musculoskeletal:        General: Tenderness (TTP between R 4th and 5th metatarsals when foot is squeezed together) present.  Skin:    General: Skin is warm and dry.     Findings: No bruising or rash.  Neurological:     General: No focal deficit present.     Mental Status: She is alert and oriented to person, place, and time.     Sensory: No sensory deficit.  Psychiatric:        Mood and Affect: Mood normal.        Behavior: Behavior normal.        Thought Content: Thought content normal.           Assessment & Plan:  Foot pain- new.  Pt is having pain when metatarsals are squeezed by her shoes after switching from summer sandals.  No abnormality on PE.  Encouraged her to try shoes w/ a wider toe box.  She is in agreement.  Neuropathy- pt reports mild burning pain of both feet.  Feels this is genetic as mom also had neuropathy.  States this is not severe enough to consider medication at this time.  Will follow.

## 2023-05-16 ENCOUNTER — Encounter: Payer: Self-pay | Admitting: Family Medicine

## 2023-05-17 ENCOUNTER — Ambulatory Visit (INDEPENDENT_AMBULATORY_CARE_PROVIDER_SITE_OTHER): Payer: Medicare Other | Admitting: Family Medicine

## 2023-05-17 ENCOUNTER — Encounter: Payer: Self-pay | Admitting: Family Medicine

## 2023-05-17 VITALS — BP 100/68 | HR 86 | Temp 97.8°F | Wt 153.1 lb

## 2023-05-17 DIAGNOSIS — R0981 Nasal congestion: Secondary | ICD-10-CM | POA: Diagnosis not present

## 2023-05-17 DIAGNOSIS — J40 Bronchitis, not specified as acute or chronic: Secondary | ICD-10-CM | POA: Diagnosis not present

## 2023-05-17 LAB — POC COVID19 BINAXNOW: SARS Coronavirus 2 Ag: NEGATIVE

## 2023-05-17 MED ORDER — BENZONATATE 200 MG PO CAPS: 200.0000 mg | ORAL_CAPSULE | Freq: Three times a day (TID) | ORAL | 0 refills | Status: DC | PRN

## 2023-05-17 MED ORDER — ALBUTEROL SULFATE HFA 108 (90 BASE) MCG/ACT IN AERS: 2.0000 | INHALATION_SPRAY | Freq: Four times a day (QID) | RESPIRATORY_TRACT | 0 refills | Status: DC | PRN

## 2023-05-17 MED ORDER — AZITHROMYCIN 250 MG PO TABS: ORAL_TABLET | ORAL | 0 refills | Status: DC

## 2023-05-17 NOTE — Patient Instructions (Signed)
Follow up as needed or as scheduled START the Zpack as directed Use the cough meds as needed Use the albuterol inhaler- 2 puffs as needed for cough, wheeze, shortness of breath Drink LOTS of fluids REST! Call with any questions or concerns Hang in there! Happy Holidays!!

## 2023-05-17 NOTE — Progress Notes (Signed)
   Subjective:    Patient ID: Katherine Blake, female    DOB: [birth year], 77 y.o.   MRN: [id number]  HPI URI- sxs started Saturday w/ sore throat, PND, congestion.  Bilateral ear itching.  No fever.  + sinus pain/pressure.  + tooth pain.  Now has productive cough (clear sputum) and hears a 'rattle' when breathing.  Denies SOB.  Using humidifier, netti pot- no relief.   Review of Systems For ROS see HPI     Objective:   Physical Exam Vitals reviewed.  Constitutional:      General: She is not in acute distress.    Appearance: Normal appearance. She is well-developed.  HENT:     Head: Normocephalic and atraumatic.     Right Ear: Tympanic membrane and ear canal normal.     Left Ear: Tympanic membrane and ear canal normal.     Nose: Congestion present. No rhinorrhea.     Comments: No TTP over frontal or maxillary sinuses Eyes:     Extraocular Movements: EOM normal.     Conjunctiva/sclera: Conjunctivae normal.     Pupils: Pupils are equal, round, and reactive to light.  Cardiovascular:     Rate and Rhythm: Normal rate and regular rhythm.     Heart sounds: Normal heart sounds. No murmur heard. Pulmonary:     Effort: Pulmonary effort is normal. No respiratory distress.     Breath sounds: Wheezing (diffuse expiratory wheezes) and rhonchi (faint crackles) present.     Comments: + hacking cough Musculoskeletal:     Cervical back: Normal range of motion and neck supple.  Lymphadenopathy:     Cervical: No cervical adenopathy.  Neurological:     Mental Status: She is alert.           Assessment & Plan:  Bronchitis w/ wheeze- new.  Pt w/o hx of wheezing.  Will start Albuterol for symptomatic relief.  Given the high levels of Mycoplasma infxn in the community will start Azithromycin for both its antibacterial properties and its anti-inflammatory properties.  Cough meds prn.  Reviewed supportive care and red flags that should prompt return.  Pt expressed understanding and is in  agreement w/ plan.

## 2023-05-18 ENCOUNTER — Encounter: Payer: Self-pay | Admitting: Family Medicine

## 2023-06-13 ENCOUNTER — Other Ambulatory Visit: Payer: Self-pay | Admitting: Family Medicine

## 2023-06-28 ENCOUNTER — Encounter: Payer: Self-pay | Admitting: Family Medicine

## 2023-08-29 ENCOUNTER — Encounter: Payer: Self-pay | Admitting: Family Medicine

## 2023-09-24 ENCOUNTER — Other Ambulatory Visit: Payer: Self-pay

## 2023-09-24 DIAGNOSIS — N952 Postmenopausal atrophic vaginitis: Secondary | ICD-10-CM

## 2023-09-24 NOTE — Telephone Encounter (Signed)
 Medication refill request: estradiol  Last AEX:  12/26/21 Next AEX: not scheduled  Last MMG (if hormonal medication request): 01/05/23 Refill authorized: please advise

## 2023-09-25 MED ORDER — NONFORMULARY OR COMPOUNDED ITEM: 2 refills | Status: AC

## 2023-10-18 DIAGNOSIS — K080 Exfoliation of teeth due to systemic causes: Secondary | ICD-10-CM | POA: Diagnosis not present

## 2023-10-20 ENCOUNTER — Other Ambulatory Visit: Payer: Self-pay | Admitting: Nurse Practitioner

## 2023-10-22 DIAGNOSIS — K080 Exfoliation of teeth due to systemic causes: Secondary | ICD-10-CM | POA: Diagnosis not present

## 2023-12-11 DIAGNOSIS — L814 Other melanin hyperpigmentation: Secondary | ICD-10-CM | POA: Diagnosis not present

## 2023-12-11 DIAGNOSIS — Z85828 Personal history of other malignant neoplasm of skin: Secondary | ICD-10-CM | POA: Diagnosis not present

## 2023-12-11 DIAGNOSIS — L821 Other seborrheic keratosis: Secondary | ICD-10-CM | POA: Diagnosis not present

## 2023-12-11 DIAGNOSIS — L578 Other skin changes due to chronic exposure to nonionizing radiation: Secondary | ICD-10-CM | POA: Diagnosis not present

## 2023-12-20 DIAGNOSIS — H52203 Unspecified astigmatism, bilateral: Secondary | ICD-10-CM | POA: Diagnosis not present

## 2024-01-07 DIAGNOSIS — Z1231 Encounter for screening mammogram for malignant neoplasm of breast: Secondary | ICD-10-CM | POA: Diagnosis not present

## 2024-01-07 LAB — HM MAMMOGRAPHY

## 2024-01-10 ENCOUNTER — Ambulatory Visit: Admitting: Cardiovascular Disease

## 2024-01-17 ENCOUNTER — Other Ambulatory Visit: Payer: Self-pay | Admitting: Cardiovascular Disease

## 2024-01-18 ENCOUNTER — Other Ambulatory Visit: Payer: Self-pay

## 2024-01-18 ENCOUNTER — Ambulatory Visit: Attending: Cardiovascular Disease | Admitting: Cardiovascular Disease

## 2024-01-18 ENCOUNTER — Encounter: Payer: Self-pay | Admitting: Cardiovascular Disease

## 2024-01-18 VITALS — BP 115/73 | HR 75 | Ht 63.0 in | Wt 153.0 lb

## 2024-01-18 DIAGNOSIS — I471 Supraventricular tachycardia, unspecified: Secondary | ICD-10-CM | POA: Diagnosis not present

## 2024-01-18 MED ORDER — METOPROLOL SUCCINATE ER 25 MG PO TB24: 12.5000 mg | ORAL_TABLET | Freq: Every day | ORAL | 3 refills | Status: AC

## 2024-01-18 NOTE — Patient Instructions (Signed)
 Medication Instructions:  No medication changes were made at this visit. Continue current regimen.   Follow-Up: At [facility], you and your health needs are our priority.  As part of our continuing mission to provide you with exceptional heart care, our providers are all part of one team.  This team includes your primary Cardiologist (physician) and Advanced Practice Providers or APPs (Physician Assistants and Nurse Practitioners) who all work together to provide you with the care you need, when you need it.  Your next appointment:   1 year(s)  Provider:   Lonni Cash, MD    We recommend signing up for the patient portal called MyChart.  Sign up information is provided on this After Visit Summary.  MyChart is used to connect with patients for Virtual Visits (Telemedicine).  Patients are able to view lab/test results, encounter notes, upcoming appointments, etc.  Non-urgent messages can be sent to your provider as well.   To learn more about what you can do with MyChart, go to [email or web address].

## 2024-01-18 NOTE — Progress Notes (Signed)
 Chief Complaint  Patient presents with   Follow-up    SVT   History of Present Illness: 78 yo female with history of SVT and HLD who is here today for follow up. She was seen in our office in July 2022 with c/o palpitations. Echo August 2022 with normal LV systolic function and no valve disease. Cardiac monitor in 2022 with 51 runs of SVT, the longest lasting 11 minutes. She was started on Toprol  and referred to EP. Dr. Waddell advised her to continue the Toprol . She was seen in our office last in May 2024 and was doing well.   She is here today for follow up. The patient denies any chest pain, dyspnea, lower extremity edema, orthopnea, PND, dizziness, near syncope or syncope. Rare palpitations.   Primary Care Physician: Mahlon Comer BRAVO, MD  Past Medical History:  Diagnosis Date   Allergy    History of chicken pox    Hyperlipidemia    SCC (squamous cell carcinoma) 04/10/2016   left scalp cx3 37fu   SCC (squamous cell carcinoma) 04/10/2016   left forehaed cx3 6fu   Squamous cell carcinoma of skin 05/09/2005   top scalp cx3 9fu   SVT (supraventricular tachycardia) (HCC)     Past Surgical History:  Procedure Laterality Date   ABDOMINAL HYSTERECTOMY  1987   TAH,BSO Endometriosis   CESAREAN SECTION     X 2   OOPHORECTOMY     BSO   PELVIC LAPAROSCOPY  1980    Current Outpatient Medications  Medication Sig Dispense Refill   albuterol  (VENTOLIN  HFA) 108 (90 Base) MCG/ACT inhaler TAKE 2 PUFFS BY MOUTH EVERY 6 HOURS AS NEEDED FOR WHEEZE OR SHORTNESS OF BREATH 8.5 each 1   azithromycin  (ZITHROMAX ) 250 MG tablet 2 tabs on day 1, 1 tab on day 2-5 6 tablet 0   benzonatate  (TESSALON ) 200 MG capsule Take 1 capsule (200 mg total) by mouth 3 (three) times daily as needed for cough. 45 capsule 0   Calcium  Carbonate-Vitamin D (CALCIUM  + D PO) Take 1 tablet by mouth daily.      Cardiac Defibrillators KIT Only to use in emergencies- unresponsiveness or cardiac distress 1 kit 0   cetirizine   (ZYRTEC ) 10 MG tablet Take 1 tablet (10 mg total) by mouth daily. 30 tablet 11   fluticasone (FLONASE) 50 MCG/ACT nasal spray Place 1 spray into both nostrils every evening.      metoprolol  succinate (TOPROL -XL) 25 MG 24 hr tablet TAKE 1/2 TABLET BY MOUTH DAILY 90 tablet 3   NONFORMULARY OR COMPOUNDED ITEM Estradiol  0.02% Vaginal Cream, Insert 1gm into vagina twice weekly. 24 each 2   rosuvastatin  (CRESTOR ) 5 MG tablet Take 1 tablet (5 mg total) by mouth daily. KEEP OV. 90 tablet 0   No current facility-administered medications for this visit.    Allergies  Allergen Reactions   Amoxicillin Diarrhea   Cephalexin Rash    Social History   Socioeconomic History   Marital status: Married    Spouse name: Not on file   Number of children: 2   Years of education: Not on file   Highest education level: Not on file  Occupational History   Occupation: REtired   Occupation: Presenter, broadcasting  Tobacco Use   Smoking status: Never   Smokeless tobacco: Never  Vaping Use   Vaping status: Never Used  Substance and Sexual Activity   Alcohol use: Yes    Alcohol/week: 2.0 standard drinks of alcohol    Types: 2 Standard  drinks or equivalent per week   Drug use: Never   Sexual activity: Yes    Birth control/protection: Surgical    Comment: first intercourse >18, less than 5 partners  Other Topics Concern   Not on file  Social History Narrative   Not on file   Social Drivers of Health   Financial Resource Strain: Low Risk  (02/22/2023)   Overall Financial Resource Strain (CARDIA)    Difficulty of Paying Living Expenses: Not very hard  Food Insecurity: No Food Insecurity (02/22/2023)   Hunger Vital Sign    Worried About Running Out of Food in the Last Year: Never true    Ran Out of Food in the Last Year: Never true  Transportation Needs: No Transportation Needs (02/22/2023)   PRAPARE - Administrator, Civil Service (Medical): No    Lack of Transportation (Non-Medical): No   Physical Activity: Sufficiently Active (02/22/2023)   Exercise Vital Sign    Days of Exercise per Week: 4 days    Minutes of Exercise per Session: 40 min  Stress: No Stress Concern Present (02/22/2023)   [employer] of Occupational Health - Occupational Stress Questionnaire    Feeling of Stress : Not at all  Social Connections: Socially Integrated (02/22/2023)   Social Connection and Isolation Panel    Frequency of Communication with Friends and Family: More than three times a week    Frequency of Social Gatherings with Friends and Family: Three times a week    Attends Religious Services: More than 4 times per year    Active Member of Clubs or Organizations: Yes    Attends Banker Meetings: More than 4 times per year    Marital Status: Married  Catering manager Violence: Not At Risk (02/22/2023)   Humiliation, Afraid, Rape, and Kick questionnaire    Fear of Current or Ex-Partner: No    Emotionally Abused: No    Physically Abused: No    Sexually Abused: No    Family History  Problem Relation Age of Onset   Dementia Mother    Thrombocytopenia Mother    Other Mother        JAK2 positive   Heart failure Father    Cancer Maternal Grandmother        Leukemia    Review of Systems:  As stated in the HPI and otherwise negative.   BP 115/73   Pulse 75   Ht 5' 3 (1.6 m)   Wt 153 lb (69.4 kg)   SpO2 97%   BMI 27.10 kg/m   Physical Examination: General: Well developed, well nourished, NAD  HEENT: OP clear, mucus membranes moist  SKIN: warm, dry. No rashes. Neuro: No focal deficits  Musculoskeletal: Muscle strength 5/5 all ext  Psychiatric: Mood and affect normal  Neck: No JVD, no carotid bruits, no thyromegaly, no lymphadenopathy.  Lungs:Clear bilaterally, no wheezes, rhonci, crackles Cardiovascular: Regular rate and rhythm. No murmurs, gallops or rubs. Abdomen:Soft. Bowel sounds present. Non-tender.  Extremities: No lower extremity edema. Pulses are 2 + in  the bilateral DP/PT.  EKG:  EKG is not ordered today. The ekg demonstrates  Recent Labs: No results found for requested labs within last 365 days.   Lipid Panel    Component Value Date/Time   CHOL 211 (H) 01/02/2022 0922   TRIG 165.0 (H) 01/02/2022 0922   HDL 62.30 01/02/2022 0922   CHOLHDL 3 01/02/2022 0922   VLDL 33.0 01/02/2022 0922   LDLCALC 115 (H) 01/02/2022 9077  Wt Readings from Last 3 Encounters:  01/18/24 153 lb (69.4 kg)  05/17/23 153 lb 2 oz (69.5 kg)  04/19/23 151 lb 4 oz (68.6 kg)    Assessment and Plan:   1. SVT: No recent episodes. Continue Toprol .   Labs/ tests ordered today include:  No orders of the defined types were placed in this encounter.  Disposition:   F/U with me in one year.     Signed, Lonni Cash, MD 01/18/2024 1:40 PM    [facility] [address] [place], [place], [place]  [address] Phone: [phone]; Fax: [phone]

## 2024-02-14 ENCOUNTER — Encounter: Payer: Self-pay | Admitting: Nurse Practitioner

## 2024-02-14 ENCOUNTER — Ambulatory Visit (INDEPENDENT_AMBULATORY_CARE_PROVIDER_SITE_OTHER): Admitting: Nurse Practitioner

## 2024-02-14 VITALS — BP 118/82 | HR 85 | Ht 61.5 in | Wt 152.0 lb

## 2024-02-14 DIAGNOSIS — N952 Postmenopausal atrophic vaginitis: Secondary | ICD-10-CM

## 2024-02-14 DIAGNOSIS — Z780 Asymptomatic menopausal state: Secondary | ICD-10-CM

## 2024-02-14 DIAGNOSIS — Z01419 Encounter for gynecological examination (general) (routine) without abnormal findings: Secondary | ICD-10-CM | POA: Diagnosis not present

## 2024-02-14 MED ORDER — ESTRADIOL 0.1 MG/GM VA CREA: 1.0000 g | TOPICAL_CREAM | VAGINAL | 2 refills | Status: AC

## 2024-02-14 NOTE — Progress Notes (Signed)
   Katherine Blake 1945-09-13 [id number]   History:  78 y.o. G2P2002 presents for breast and pelvic exam. Postmenopausal - no HRT. S/P 1987 TAH BSO for endometriosis. Using compounded vaginal estrogen for dryness and painful intercourse. Normal pap and mammogram history.   Gynecologic History No LMP recorded. Patient has had a hysterectomy.   Contraception/Family planning: status post hysterectomy Sexually active: No  Health Maintenance Last Pap: 11/19/2018. Results were: Normal Last mammogram: 01/07/2024. Results were: Normal Last colonoscopy: 2012. Negative Cologuard 09/2022 Last Dexa: 12/30/2019. Results were: Normal  Past medical history, past surgical history, family history and social history were all reviewed and documented in the EPIC chart. Married. Son in [place] - has 3 and 37 yo. Daughter in [place] - has twin 29 yos, boy and girl.   ROS:  A ROS was performed and pertinent positives and negatives are included.  Exam:  Vitals:   02/14/24 1450  BP: 118/82  Pulse: 85  SpO2: 97%  Weight: 152 lb (68.9 kg)  Height: 5' 1.5 (1.562 m)     Body mass index is 28.26 kg/m.  General appearance:  Normal Thyroid :  Symmetrical, normal in size, without palpable masses or nodularity. Respiratory  Auscultation:  Clear without wheezing or rhonchi Cardiovascular  Auscultation:  Regular rate, without rubs, murmurs or gallops  Edema/varicosities:  Not grossly evident Abdominal  Soft,nontender, without masses, guarding or rebound.  Liver/spleen:  No organomegaly noted  Hernia:  None appreciated  Skin  Inspection:  Grossly normal Breasts: Examined lying and sitting.   Right: Without masses, retractions, nipple discharge or axillary adenopathy.   Left: Without masses, retractions, nipple discharge or axillary adenopathy. Pelvic: External genitalia:  no lesions              Urethra:  normal appearing urethra with no masses, tenderness or lesions              Bartholins and Skenes:  normal                 Vagina: normal appearing vagina with normal color and discharge, no lesions. Atrophic changes              Cervix: absent Bimanual Exam:  Uterus:  absent              Adnexa: no mass, fullness, tenderness              Rectovaginal: Deferred              Anus:  normal, no lesions  Dereck Keas, CMA present as chaperone.   Assessment/Plan:  78 y.o. [id number] for annual exam.   Well female exam with routine gynecological exam - Education provided on SBEs, importance of preventative screenings, current guidelines, high calcium  diet, regular exercise, and multivitamin daily. Labs with PCP.   Postmenopausal - No HRT. S/P 1987 TAH BSO for endometriosis  Vaginal atrophy - Plan: estradiol  (ESTRACE  VAGINAL) 0.1 MG/GM vaginal cream twice weekly.   Screening for cervical cancer - Normal Pap history.  No longer screening per guidelines.   Screening for breast cancer - Normal mammogram history.  Continue annual screenings.  Normal breast exam today.  Screening for colon cancer - 2012 colonoscopy. 2024 negative Cologuard.   Screening for osteoporosis - Normal bone density 2021. Will repeat at 5-year interval.   Return in about 1 year (around 02/13/2025) for Med follow up.    Annabella DELENA Shutter DNP, 3:15 PM 02/14/2024

## 2024-03-05 ENCOUNTER — Ambulatory Visit (INDEPENDENT_AMBULATORY_CARE_PROVIDER_SITE_OTHER): Payer: Medicare Other

## 2024-03-05 ENCOUNTER — Telehealth: Payer: Self-pay

## 2024-03-05 VITALS — Ht 61.5 in | Wt 152.0 lb

## 2024-03-05 DIAGNOSIS — Z0000 Encounter for general adult medical examination without abnormal findings: Secondary | ICD-10-CM | POA: Diagnosis not present

## 2024-03-05 NOTE — Telephone Encounter (Signed)
 Pt was seen 05/16/2024 for acute visit. This appt has been in last 6 months. Dr.Tabori is not going to drop pt unless this has been 3 years. She will have to verify with insurance about them dropping her since she can not get CPE until next year. Please update note when pt calls back.

## 2024-03-05 NOTE — Progress Notes (Signed)
 Subjective:   Katherine Blake is a 78 y.o. who presents for a Medicare Wellness preventive visit.  As a reminder, Annual Wellness Visits don't include a physical exam, and some assessments may be limited, especially if this visit is performed virtually. We may recommend an in-person follow-up visit with your provider if needed.  Visit Complete: Virtual I connected with  AILY TZENG on 03/05/24 by a audio enabled telemedicine application and verified that I am speaking with the correct person using two identifiers.  Patient Location: Home  Provider Location: Home Office  I discussed the limitations of evaluation and management by telemedicine. The patient expressed understanding and agreed to proceed.  Vital Signs: Because this visit was a virtual/telehealth visit, some criteria may be missing or patient reported. Any vitals not documented were not able to be obtained and vitals that have been documented are patient reported.  VideoDeclined- This patient declined Librarian, academic. Therefore the visit was completed with audio only.  Persons Participating in Visit: Patient.  AWV Questionnaire: No: Patient Medicare AWV questionnaire was not completed prior to this visit.  Cardiac Risk Factors include: advanced age (>46men, >28 women);dyslipidemia     Objective:    Today's Vitals   03/05/24 0846  Weight: 152 lb (68.9 kg)  Height: 5' 1.5 (1.562 m)   Body mass index is 28.26 kg/m.     03/05/2024    8:53 AM 02/22/2023    8:04 AM 02/15/2022   11:13 AM 01/10/2021    9:07 AM 01/06/2020    9:47 AM 09/11/2019    6:57 PM 11/13/2018   10:09 AM  Advanced Directives  Does Patient Have a Medical Advance Directive? Yes Yes Yes Yes Yes No Yes  Type of Estate agent of [place];Living will Healthcare Power of [employer] of [place];Living will Healthcare Power of [place];Living will Healthcare Power of [place];Living will   Healthcare Power of [place];Living will  Does patient want to make changes to medical advance directive? No - Patient declined        Copy of Healthcare Power of Attorney in Chart? Yes - validated most recent copy scanned in chart (See row information) Yes - validated most recent copy scanned in chart (See row information) No - copy requested Yes - validated most recent copy scanned in chart (See row information) No - copy requested  Yes - validated most recent copy scanned in chart (See row information)   Would patient like information on creating a medical advance directive?      No - Patient declined      Data saved with a previous flowsheet row definition    Current Medications (verified) Outpatient Encounter Medications as of 03/05/2024  Medication Sig   albuterol  (VENTOLIN  HFA) 108 (90 Base) MCG/ACT inhaler TAKE 2 PUFFS BY MOUTH EVERY 6 HOURS AS NEEDED FOR WHEEZE OR SHORTNESS OF BREATH   Calcium  Carbonate-Vitamin D (CALCIUM  + D PO) Take 1 tablet by mouth daily.    Cardiac Defibrillators KIT Only to use in emergencies- unresponsiveness or cardiac distress   cetirizine  (ZYRTEC ) 10 MG tablet Take 1 tablet (10 mg total) by mouth daily.   estradiol  (ESTRACE  VAGINAL) 0.1 MG/GM vaginal cream Place 1 g vaginally 2 (two) times a week.   fluticasone (FLONASE) 50 MCG/ACT nasal spray Place 1 spray into both nostrils every evening.    metoprolol  succinate (TOPROL -XL) 25 MG 24 hr tablet Take 0.5 tablets (12.5 mg total) by mouth daily.   NONFORMULARY OR  COMPOUNDED ITEM Estradiol  0.02% Vaginal Cream, Insert 1gm into vagina twice weekly.   rosuvastatin  (CRESTOR ) 5 MG tablet Take 1 tablet (5 mg total) by mouth daily. KEEP OV.   No facility-administered encounter medications on file as of 03/05/2024.    Allergies (verified) Amoxicillin and Cephalexin   History: Past Medical History:  Diagnosis Date   Allergy    History of chicken pox    Hyperlipidemia    SCC (squamous cell carcinoma) 04/10/2016    left scalp cx3 4fu   SCC (squamous cell carcinoma) 04/10/2016   left forehaed cx3 84fu   Squamous cell carcinoma of skin 05/09/2005   top scalp cx3 62fu   SVT (supraventricular tachycardia)    Past Surgical History:  Procedure Laterality Date   ABDOMINAL HYSTERECTOMY  1987   TAH,BSO Endometriosis   CESAREAN SECTION     X 2   OOPHORECTOMY     BSO   PELVIC LAPAROSCOPY  1980   Family History  Problem Relation Age of Onset   Dementia Mother    Thrombocytopenia Mother    Other Mother        JAK2 positive   Heart failure Father    Cancer Maternal Grandmother        Leukemia   Social History   Socioeconomic History   Marital status: Married    Spouse name: Acupuncturist   Number of children: 2   Years of education: Not on file   Highest education level: Not on file  Occupational History   Occupation: REtired   Occupation: Presenter, broadcasting  Tobacco Use   Smoking status: Never   Smokeless tobacco: Never  Vaping Use   Vaping status: Never Used  Substance and Sexual Activity   Alcohol use: Yes    Alcohol/week: 2.0 standard drinks of alcohol    Types: 2 Glasses of wine per week    Comment: occ   Drug use: Never   Sexual activity: Not Currently    Birth control/protection: Surgical    Comment: first intercourse >18, less than 5 partners  Other Topics Concern   Not on file  Social History Narrative   Lives with husband/2025 and 1 cat   Social Drivers of Health   Financial Resource Strain: Low Risk  (03/05/2024)   Overall Financial Resource Strain (CARDIA)    Difficulty of Paying Living Expenses: Not hard at all  Food Insecurity: No Food Insecurity (03/05/2024)   Hunger Vital Sign    Worried About Running Out of Food in the Last Year: Never true    Ran Out of Food in the Last Year: Never true  Transportation Needs: No Transportation Needs (03/05/2024)   PRAPARE - Administrator, Civil Service (Medical): No    Lack of Transportation (Non-Medical): No  Physical  Activity: Sufficiently Active (03/05/2024)   Exercise Vital Sign    Days of Exercise per Week: 6 days    Minutes of Exercise per Session: 60 min  Stress: No Stress Concern Present (03/05/2024)   [employer] of Occupational Health - Occupational Stress Questionnaire    Feeling of Stress: Not at all  Social Connections: Socially Integrated (03/05/2024)   Social Connection and Isolation Panel    Frequency of Communication with Friends and Family: More than three times a week    Frequency of Social Gatherings with Friends and Family: Three times a week    Attends Religious Services: More than 4 times per year    Active Member of Clubs or Organizations: Yes  Attends Engineer, structural: More than 4 times per year    Marital Status: Married    Tobacco Counseling Counseling given: Not Answered    Clinical Intake:  Pre-visit preparation completed: Yes  Pain : No/denies pain     BMI - recorded: 28.26 Nutritional Status: BMI 25 -29 Overweight Nutritional Risks: None Diabetes: No  No results found for: HGBA1C   How often do you need to have someone help you when you read instructions, pamphlets, or other written materials from your doctor or pharmacy?: 1 - Never  Interpreter Needed?: No  Information entered by :: Jerone Cudmore, RMA   Activities of Daily Living     03/05/2024    8:47 AM  In your present state of health, do you have any difficulty performing the following activities:  Hearing? 0  Vision? 0  Difficulty concentrating or making decisions? 0  Walking or climbing stairs? 0  Dressing or bathing? 0  Doing errands, shopping? 0  Preparing Food and eating ? N  Using the Toilet? N  In the past six months, have you accidently leaked urine? Y  Do you have problems with loss of bowel control? N  Managing your Medications? N  Managing your Finances? N  Housekeeping or managing your Housekeeping? N    Patient Care Team: Mahlon Comer BRAVO, MD as  PCP - General (Family Medicine) Verlin Lonni BIRCH, MD as PCP - Cardiology (Cardiology) Fleeta Smock, Lamar BROCKS, MD as Consulting Physician (Allergy and Immunology) Kristie Lamprey, MD as Consulting Physician (Gastroenterology) Livingston Rigg, MD as Consulting Physician (Dermatology) Arnaldo Juliene RAMAN, MD as Attending Physician (Family Medicine) [place] (Dentistry) Prentiss Annabella LABOR, NP as Nurse Practitioner (Gynecology)  I have updated your Care Teams any recent Medical Services you may have received from other providers in the past year.     Assessment:   This is a routine wellness examination for [place].  Hearing/Vision screen Hearing Screening - Comments:: Denies hearing difficulties   Vision Screening - Comments:: Wears eyeglasses/Dr. Robinson   Goals Addressed   None    Depression Screen     03/05/2024    8:57 AM 02/14/2024    2:56 PM 04/19/2023    9:31 AM 02/22/2023    8:09 AM 05/08/2022    2:31 PM 02/15/2022   11:12 AM 01/02/2022    9:01 AM  PHQ 2/9 Scores  PHQ - 2 Score 0 0 0 0 0 0 0  PHQ- 9 Score 0  0 0 0 0 0    Fall Risk     03/05/2024    8:54 AM 02/14/2024    2:56 PM 04/19/2023    9:31 AM 02/22/2023    8:04 AM 05/08/2022    2:31 PM  Fall Risk   Falls in the past year? 0 0 0 0 0  Number falls in past yr: 0 0 0 0   Injury with Fall? 0 0 0 0   Risk for fall due to :  No Fall Risks No Fall Risks  No Fall Risks  Follow up Falls evaluation completed;Falls prevention discussed Falls evaluation completed Falls evaluation completed Education provided;Falls evaluation completed;Falls prevention discussed Falls evaluation completed      Data saved with a previous flowsheet row definition    MEDICARE RISK AT HOME:  Medicare Risk at Home Any stairs in or around the home?: Yes If so, are there any without handrails?: No Home free of loose throw rugs in walkways, pet beds, electrical cords, etc?: Yes  Adequate lighting in your home to reduce risk of falls?: Yes Life alert?:  No Use of a cane, walker or w/c?: No Grab bars in the bathroom?: Yes Shower chair or bench in shower?: Yes Elevated toilet seat or a handicapped toilet?: Yes  TIMED UP AND GO:  Was the test performed?  No  Cognitive Function: Declined/Normal: No cognitive concerns noted by patient or family. Patient alert, oriented, able to answer questions appropriately and recall recent events. No signs of memory loss or confusion.    11/13/2018   10:11 AM  MMSE - Mini Mental State Exam  Orientation to time 5  Orientation to Place 5  Registration 3  Attention/ Calculation 5  Recall 1  Language- name 2 objects 2  Language- repeat 1  Language- follow 3 step command 3  Language- read & follow direction 1  Write a sentence 1  Copy design 1  Total score 28        02/22/2023    8:11 AM 02/15/2022   11:14 AM 01/06/2020    9:56 AM  6CIT Screen  What Year? 0 points 0 points 0 points  What month? 0 points 0 points 0 points  What time? 0 points 0 points 0 points  Count back from 20 0 points 0 points 0 points  Months in reverse 0 points 0 points 0 points  Repeat phrase 0 points 0 points 0 points  Total Score 0 points 0 points 0 points    Immunizations Immunization History  Administered Date(s) Administered    sv, Bivalent, Protein Subunit Rsvpref,pf (Abrysvo) 03/28/2023   Fluad Quad(high Dose 65+) 02/17/2020, 02/15/2022, 02/29/2024   INFLUENZA, HIGH DOSE SEASONAL PF 02/12/2015, 03/02/2019   Influenza,inj,Quad PF,6+ Mos 03/13/2016, 02/22/2017, 02/01/2018   Influenza-Unspecified 03/13/2016, 01/10/2021, 02/15/2022, 03/12/2023   PFIZER SARS-COV-2 Pediatric Vaccination 5-73yrs 09/21/2020   PFIZER(Purple Top)SARS-COV-2 Vaccination 07/03/2019, 07/22/2019, 03/15/2020   Pneumococcal Conjugate-13 12/04/2013   Pneumococcal Polysaccharide-23 08/30/2017   Tdap 12/25/2017   Zoster Recombinant(Shingrix) 06/28/2023, 08/28/2023   Zoster, Live 10/18/2010    Screening Tests Health Maintenance  Topic Date  Due   Hepatitis C Screening  Never done   COVID-19 Vaccine (4 - 2025-26 season) 02/11/2024   Medicare Annual Wellness (AWV)  02/22/2024   Mammogram  01/06/2025   DTaP/Tdap/Td (2 - Td or Tdap) 12/26/2027   Pneumococcal Vaccine: 50+ Years  Completed   Influenza Vaccine  Completed   DEXA SCAN  Completed   Zoster Vaccines- Shingrix  Completed   HPV VACCINES  Aged Out   Meningococcal B Vaccine  Aged Out   Colonoscopy  Discontinued   Fecal DNA (Cologuard)  Discontinued    Health Maintenance Items Addressed: See Nurse Notes at the end of this note  Additional Screening:  Vision Screening: Recommended annual ophthalmology exams for early detection of glaucoma and other disorders of the eye. Is the patient up to date with their annual eye exam?  No  Who is the provider or what is the name of the office in which the patient attends annual eye exams? Dr. Robinson  Dental Screening: Recommended annual dental exams for proper oral hygiene  Community Resource Referral / Chronic Care Management: CRR required this visit?  No   CCM required this visit?  No   Plan:    I have personally reviewed and noted the following in the patient's chart:   Medical and social history Use of alcohol, tobacco or illicit drugs  Current medications and supplements including opioid prescriptions. Patient is not currently  taking opioid prescriptions. Functional ability and status Nutritional status Physical activity Advanced directives List of other physicians Hospitalizations, surgeries, and ER visits in previous 12 months Vitals Screenings to include cognitive, depression, and falls Referrals and appointments  In addition, I have reviewed and discussed with patient certain preventive protocols, quality metrics, and best practice recommendations. A written personalized care plan for preventive services as well as general preventive health recommendations were provided to patient.   Shadasia Oldfield L Jaison Petraglia,  CMA   03/05/2024   After Visit Summary: (MyChart) Due to this being a telephonic visit, the after visit summary with patients personalized plan was offered to patient via MyChart   Notes: Patient is due for a Hep C screening and can get that done during her next office visit.  Patient is up to date on all other health maintenance with no concerns to address today.

## 2024-03-05 NOTE — Telephone Encounter (Unsigned)
 Copied from CRM #[id number]. Topic: General - Call Back - No Documentation >> Mar 05, 2024  9:55 AM Rea ORN wrote: Reason for CRM: Pt called Tonya back. I advised pt that she will not be dropped as a pt as she has been seen within the past year. I told her had she not been seen within the past 3 years she would be dropped. She will keep her January CPE.

## 2024-03-05 NOTE — Patient Instructions (Signed)
 Ms. Antonetti,  Thank you for taking the time for your Medicare Wellness Visit. I appreciate your continued commitment to your health goals. Please review the care plan we discussed, and feel free to reach out if I can assist you further.  Medicare recommends these wellness visits once per year to help you and your care team stay ahead of potential health issues. These visits are designed to focus on prevention, allowing your provider to concentrate on managing your acute and chronic conditions during your regular appointments.  Please note that Annual Wellness Visits do not include a physical exam. Some assessments may be limited, especially if the visit was conducted virtually. If needed, we may recommend a separate in-person follow-up with your provider.  Ongoing Care Seeing your primary care provider every 3 to 6 months helps us  monitor your health and provide consistent, personalized care. Last office visit on 05/17/2023.  You are due for a Hep C screening and can get that done during your next office visit.  Remember to call the office and get scheduled for your yearly.  Referrals If a referral was made during today's visit and you haven't received any updates within two weeks, please contact the referred provider directly to check on the status.  Recommended Screenings:  Health Maintenance  Topic Date Due   Hepatitis C Screening  Never done   Flu Shot  01/11/2024   COVID-19 Vaccine (4 - 2025-26 season) 02/11/2024   Medicare Annual Wellness Visit  02/22/2024   Breast Cancer Screening  01/06/2025   DTaP/Tdap/Td vaccine (2 - Td or Tdap) 12/26/2027   Pneumococcal Vaccine for age over 36  Completed   DEXA scan (bone density measurement)  Completed   Zoster (Shingles) Vaccine  Completed   HPV Vaccine  Aged Out   Meningitis B Vaccine  Aged Out   Colon Cancer Screening  Discontinued   Cologuard (Stool DNA test)  Discontinued       03/05/2024    8:53 AM  Advanced Directives  Does  Patient Have a Medical Advance Directive? Yes  Type of Estate agent of [place];Living will  Does patient want to make changes to medical advance directive? No - Patient declined  Copy of Healthcare Power of Attorney in Chart? Yes - validated most recent copy scanned in chart (See row information)   Advance Care Planning is important because it: Ensures you receive medical care that aligns with your values, goals, and preferences. Provides guidance to your family and loved ones, reducing the emotional burden of decision-making during critical moments.  Vision: Annual vision screenings are recommended for early detection of glaucoma, cataracts, and diabetic retinopathy. These exams can also reveal signs of chronic conditions such as diabetes and high blood pressure.  Dental: Annual dental screenings help detect early signs of oral cancer, gum disease, and other conditions linked to overall health, including heart disease and diabetes.  Please see the attached documents for additional preventive care recommendations.

## 2024-03-05 NOTE — Telephone Encounter (Signed)
 Copied from CRM [id number]. Topic: Appointments - Scheduling Inquiry for Clinic >> Mar 05, 2024  9:17 AM Mesmerise C wrote: Reason for CRM: Patient had her awv and was told if she doesn't schedule a visit with her provider before the end of the year will be dropped as a patient and needs to be at least seen for a physical before the end of the year, scheduled a physical for January 8th for the patient she would like to be kept on as a patient of Dr. Mahlon and would like to know since she made appointment will she keep being seen by provider

## 2024-04-01 ENCOUNTER — Ambulatory Visit: Admitting: Cardiovascular Disease

## 2024-04-09 ENCOUNTER — Other Ambulatory Visit: Payer: Self-pay | Admitting: Nurse Practitioner

## 2024-05-13 DIAGNOSIS — K080 Exfoliation of teeth due to systemic causes: Secondary | ICD-10-CM | POA: Diagnosis not present

## 2024-05-16 ENCOUNTER — Ambulatory Visit: Admitting: Student in an Organized Health Care Education/Training Program

## 2024-05-16 ENCOUNTER — Encounter: Payer: Self-pay | Admitting: Student in an Organized Health Care Education/Training Program

## 2024-05-16 VITALS — BP 148/80 | HR 80 | Wt 157.0 lb

## 2024-05-16 DIAGNOSIS — H6120 Impacted cerumen, unspecified ear: Secondary | ICD-10-CM | POA: Insufficient documentation

## 2024-05-16 DIAGNOSIS — H6121 Impacted cerumen, right ear: Secondary | ICD-10-CM | POA: Diagnosis not present

## 2024-05-16 DIAGNOSIS — R52 Pain, unspecified: Secondary | ICD-10-CM

## 2024-05-16 DIAGNOSIS — J988 Other specified respiratory disorders: Secondary | ICD-10-CM | POA: Insufficient documentation

## 2024-05-16 LAB — POC COVID19 BINAXNOW: SARS Coronavirus 2 Ag: NEGATIVE

## 2024-05-16 LAB — POCT INFLUENZA A/B
Influenza A, POC: NEGATIVE
Influenza B, POC: NEGATIVE

## 2024-05-16 MED ORDER — BENZONATATE 200 MG PO CAPS: 200.0000 mg | ORAL_CAPSULE | Freq: Two times a day (BID) | ORAL | 0 refills | Status: AC | PRN

## 2024-05-16 MED ORDER — DOXYCYCLINE HYCLATE 100 MG PO TABS: 100.0000 mg | ORAL_TABLET | Freq: Two times a day (BID) | ORAL | 0 refills | Status: DC

## 2024-05-16 MED ORDER — GUAIFENESIN-CODEINE 100-10 MG/5ML PO SOLN: 5.0000 mL | Freq: Every evening | ORAL | 0 refills | Status: DC | PRN

## 2024-05-16 NOTE — Patient Instructions (Signed)
  VISIT SUMMARY: During your visit, we discussed your symptoms of an upper respiratory tract infection, including a sore throat, head congestion, sneezing, coughing, and body aches. We reviewed your current medications and history of allergies and pneumonia. Based on your symptoms and test results, we have developed a treatment plan to help manage your condition and prevent complications.  YOUR PLAN: -ACUTE UPPER RESPIRATORY INFECTION WITH BRONCHITIS: You have a viral bronchitis, which is an inflammation of the bronchial tubes in your lungs caused by a virus. Your COVID and flu tests were negative, and there are no signs of pneumonia or a sinus infection at this time. However, due to your history of pneumonia, we have prescribed doxycycline  to take if your symptoms worsen, such as developing a fever, increased body aches, shortness of breath, or increased sputum. You should perform daily sinus irrigation with a neti pot and use Flonase once or twice daily. Afrin can be used for up to three days, twice daily, to help with nasal congestion. Continue taking Tylenol and consider adding ibuprofen, two tablets twice daily for three to four days, to help with pain and inflammation. Benzonatate  is prescribed for daytime cough suppression, and low-dose codeine  cough syrup for nighttime cough, but be cautious as it may cause drowsiness and increase the risk of falls. Stay home over the weekend and monitor your symptoms.  INSTRUCTIONS: If your symptoms worsen, such as developing a fever, increased body aches, shortness of breath, or increased sputum, start taking the prescribed doxycycline . Continue with the recommended treatments and monitor your symptoms closely. If you have any concerns or if your condition does not improve, please contact our office for further evaluation.

## 2024-05-16 NOTE — Assessment & Plan Note (Signed)
 Procedure Note: Manual Removal of Impacted Cerumen Using a Curette   Indication:  Cerumen impaction in the right ear causing symptoms (e.g., hearing loss, pain, tinnitus) or preventing assessment of the ear canal and tympanic membrane.  Procedure:  Explained the procedure to the patient and informed consent was obtained  Review patient history for contraindications (e.g., nonintact tympanic membrane, history of ear surgery, anatomical abnormalities).  After a position of the patient's head upright, I visualized the ear canal and cerumen using an otoscope.  I gently inserted the curette into the ear canal avoiding contact with the canal walls, and carefully scooped the cerumen, removing it in small pieces.  I reassessed the ear canal and tympanic membrane, there was no residual cerumen nor signs of trauma.  Follow-Up:  I instructed the patient to report any persistent symptoms such as pain, discharge, or hearing loss.  Schedule a follow-up appointment if necessary.

## 2024-05-16 NOTE — Progress Notes (Signed)
 Acute Office Visit  Patient ID: Katherine Blake, female    DOB: [birth year], 78 y.o.   MRN: [id number]  PCP: Mahlon Comer BRAVO, MD  Chief Complaint  Patient presents with   Sore Throat    Sore throat last Sunday and now starting to get congestion,head hurts,face pain, nasal congestion,body aches,cough.  Would like to be tested for Flu/Covid.     Subjective:     HPI  Discussed the use of AI scribe software for clinical note transcription with the patient, who gave verbal consent to proceed.  History of Present Illness Katherine Blake is a 78 year old female who presents with symptoms of an upper respiratory tract infection.  She has been experiencing symptoms since last Sunday, beginning with a sore throat that has progressively worsened. She also has head congestion, sneezing, coughing, and body aches, with the sore throat being the most bothersome symptom. No fever is present, but her right ear felt blocked last night. She experiences alternating nasal congestion and rhinorrhea, severe enough to require Benadryl. Afrin has been used to aid breathing at night, though she prefers to limit its use.  Her cough started as dry but has become productive with mucus, occurring throughout the day and particularly upon waking. She has been taking Tylenol every four hours to manage her symptoms. No history of asthma or COPD and no pain with deep breaths. She maintains good hydration and has no abdominal pain.  She has a history of allergies and previous episodes of pneumonia. She regularly uses Flonase and has a neti pot, though it has not been used during this illness.      Objective:    BP (!) 148/80   Pulse 80   Wt 157 lb (71.2 kg)   BMI 29.18 kg/m   Physical Exam  Gen: Tired appearing older woman, mild frailty Ears: Right ear was impacted with cerumen, this was removed with a curette, both tympanic membranes appear normal with no effusions in the middle ear Mouth: Mild erythema  in the posterior oropharynx without exudate Neck: No tender cervical adenopathy Heart: Regular, no murmur Lungs: Unlabored, frequent coughing, no wheezing or crackles throughout Ext: Warm, no edema  Results for orders placed or performed in visit on 05/16/24  POC COVID-19  Result Value Ref Range   SARS Coronavirus 2 Ag Negative Negative  POCT Influenza A/B  Result Value Ref Range   Influenza A, POC Negative Negative   Influenza B, POC Negative Negative       Assessment & Plan:   Problem List Items Addressed This Visit       Unprioritized   Respiratory tract infection - Primary   Relevant Medications   doxycycline  (VIBRA -TABS) 100 MG tablet   guaiFENesin -codeine  100-10 MG/5ML syrup   benzonatate  (TESSALON ) 200 MG capsule   Cerumen impaction   Other Visit Diagnoses       Body aches       Relevant Orders   POC COVID-19 (Completed)   POCT Influenza A/B (Completed)       Assessment and Plan Assessment & Plan Acute upper respiratory infection with bronchitis   Symptoms indicate viral bronchitis, with negative COVID and flu tests. No signs of pneumonia or sinus infection, though potential progression to pneumonia was discussed. Doxycycline  is prescribed due to her history of pneumonia and current symptoms. She should take doxycycline  if symptoms worsen, such as fever, increased body aches, shortness of breath, or increased sputum. Daily sinus irrigation with a neti pot  is recommended, along with Flonase once or twice daily. Afrin can be used for up to three days, twice daily. Continue Tylenol and consider ibuprofen, two tablets twice daily for three to four days. Benzonatate  is prescribed for daytime cough suppression, and low-dose codeine  cough syrup for nighttime cough, with caution regarding drowsiness and fall risk. She should stay home over the weekend and monitor her symptoms.    Meds ordered this encounter  Medications   doxycycline  (VIBRA -TABS) 100 MG tablet    Sig:  Take 1 tablet (100 mg total) by mouth 2 (two) times daily.    Dispense:  14 tablet    Refill:  0   guaiFENesin -codeine  100-10 MG/5ML syrup    Sig: Take 5 mLs by mouth at bedtime as needed for cough.    Dispense:  120 mL    Refill:  0   benzonatate  (TESSALON ) 200 MG capsule    Sig: Take 1 capsule (200 mg total) by mouth 2 (two) times daily as needed for cough.    Dispense:  20 capsule    Refill:  0    Return if symptoms worsen or fail to improve.  Cleatus Debby Specking, MD [facility]

## 2024-05-16 NOTE — Assessment & Plan Note (Signed)
 Symptoms and exam are most consistent with a viral upper respiratory tract infection, with negative COVID and flu tests in the office. No signs of pneumonia or sinus infection, though potential progression to pneumonia was discussed. Doxycycline  is prescribed due to her history of pneumonia, frailty and advanced age and current symptoms. She should take doxycycline  if symptoms worsen, such as fever, increased body aches, shortness of breath, or increased sputum.  We talked about the risks of antibiotics, indeed she has experienced diarrhea with amoxicillin in the past.  She understands only to use this if her symptoms worsen over the weekend.  Daily sinus irrigation with a neti pot is recommended, along with Flonase once or twice daily. Afrin can be used for up to three days, twice daily. Continue Tylenol and consider ibuprofen, two tablets twice daily for three to four days. Benzonatate  is prescribed for daytime cough suppression, and low-dose codeine  cough syrup for nighttime cough, with caution regarding drowsiness and fall risk. She should stay home over the weekend and monitor her symptoms.

## 2024-05-20 ENCOUNTER — Other Ambulatory Visit: Payer: Self-pay | Admitting: Family Medicine

## 2024-05-20 ENCOUNTER — Encounter: Payer: Self-pay | Admitting: Family Medicine

## 2024-05-20 ENCOUNTER — Ambulatory Visit: Payer: Self-pay

## 2024-05-20 MED ORDER — ALBUTEROL SULFATE HFA 108 (90 BASE) MCG/ACT IN AERS: 2.0000 | INHALATION_SPRAY | Freq: Four times a day (QID) | RESPIRATORY_TRACT | 1 refills | Status: AC | PRN

## 2024-05-20 NOTE — Telephone Encounter (Signed)
 Patient messaged in and is asking if she should go ahead and start taking abx.

## 2024-05-20 NOTE — Telephone Encounter (Signed)
 FYI Only or Action Required?: Action required by provider: medication refill request and update on patient condition. Last seen during OV on 12/5 four URI. Reports cough has not improved. Asking if albuterol  inhaler can be refilled/sent to following pharmacy:   [id number] - [place], [place] - [address]   Patient was last seen in primary care on 05/16/2024 by Jerrell Cleatus Ned, MD.  Called Nurse Triage reporting Cough and Shortness of Breath.  Symptoms began a week ago.  Interventions attempted: OTC medications: Tylenol and aleve.  Symptoms are: gradually worsening.  Triage Disposition: See Physician Within 24 Hours  Patient/caregiver understands and will follow disposition?: No, wishes to speak with PCP   Copied from CRM #[id number]. Topic: Clinical - Red Word Triage >> May 20, 2024  9:50 AM Dedra B wrote: [facility] that prompted transfer to Nurse Triage: Pt was seen in clinic last week and prescribed a cough syrup for cough. She still has a terrible cough and is now wheezing. Warm transfer to NT. Reason for Disposition  [1] Continuous (nonstop) coughing interferes with work or school AND [2] no improvement using cough treatment per Care Advice  Answer Assessment - Initial Assessment Questions Pt with URI x1 week. Saw provider in office on 12/5. Prescribed tessalon  perles, guaifenesin  with codeine  at night and doxycycline . Pt to take abx only if it became worse which she has not taken yet. Pt states it has worsened. Moderate to severe cough with clear sputum. Mild rattling and wheezing all the time. Mild SOB. Denies fever or coughing up blood. Denies hx of asthma or COPD. Speaking in clear full continuous sentences over the phone, does not sound acutely distressed. Using left-over albuterol  inhaler from a previous URI which has helped. Inhaler now empty. Pt declines appt, asking if albuterol  can be refilled. Advised pt to take doxycyline per  provider instructions for worsening cough and would send request for albuterol  to clinical team. Advised appt may be required, pt verbalizes understanding. Advised UC or ED for worsening symptoms.   1. ONSET: When did the cough begin?      1 week ago  2. SEVERITY: How bad is the cough today?      Episodes severe coughing, not constant  3. SPUTUM: Describe the color of your sputum (e.g., none, dry cough; clear, white, yellow, green)     Clear  4. HEMOPTYSIS: Are you coughing up any blood? If Yes, ask: How much? (e.g., flecks, streaks, tablespoons, etc.)     Denies  5. DIFFICULTY BREATHING: Are you having difficulty breathing? If Yes, ask: How bad is it? (e.g., mild, moderate, severe)      Mild  6. FEVER: Do you have a fever? If Yes, ask: What is your temperature, how was it measured, and when did it start?     Denies  7. CARDIAC HISTORY: Do you have any history of heart disease? (e.g., heart attack, congestive heart failure)      Denies  8. LUNG HISTORY: Do you have any history of lung disease?  (e.g., pulmonary embolus, asthma, emphysema)     Hx of bronchitis that gets close to PNA  9. PE RISK FACTORS: Do you have a history of blood clots? (or: recent major surgery, recent prolonged travel, bedridden)     Denies  10. OTHER SYMPTOMS: Do you have any other symptoms? (e.g., runny nose, wheezing, chest pain)       Mild wheezing and rattling. Denies CP.  Headache.  Protocols used: Cough - Acute Productive-A-AH

## 2024-05-20 NOTE — Telephone Encounter (Signed)
 Refilled

## 2024-05-20 NOTE — Telephone Encounter (Signed)
 Pt was seen by Dr Jerrell 05/16/2024 for URI would you be willing to send in albuterol  inhaler?

## 2024-06-19 ENCOUNTER — Ambulatory Visit (INDEPENDENT_AMBULATORY_CARE_PROVIDER_SITE_OTHER): Admitting: Family Medicine

## 2024-06-19 ENCOUNTER — Encounter: Payer: Self-pay | Admitting: Family Medicine

## 2024-06-19 VITALS — BP 110/70 | HR 78 | Ht 62.0 in | Wt 154.0 lb

## 2024-06-19 DIAGNOSIS — Z0000 Encounter for general adult medical examination without abnormal findings: Secondary | ICD-10-CM

## 2024-06-19 DIAGNOSIS — E785 Hyperlipidemia, unspecified: Secondary | ICD-10-CM | POA: Diagnosis not present

## 2024-06-19 LAB — HEPATIC FUNCTION PANEL
ALT: 19 U/L (ref 3–35)
AST: 21 U/L (ref 5–37)
Albumin: 4.5 g/dL (ref 3.5–5.2)
Alkaline Phosphatase: 59 U/L (ref 39–117)
Bilirubin, Direct: 0.1 mg/dL (ref 0.1–0.3)
Total Bilirubin: 0.7 mg/dL (ref 0.2–1.2)
Total Protein: 6.7 g/dL (ref 6.0–8.3)

## 2024-06-19 LAB — CBC WITH DIFFERENTIAL/PLATELET
Basophils Absolute: 0 K/uL (ref 0.0–0.1)
Basophils Relative: 1.1 % (ref 0.0–3.0)
Eosinophils Absolute: 0.1 K/uL (ref 0.0–0.7)
Eosinophils Relative: 3.3 % (ref 0.0–5.0)
HCT: 38.1 % (ref 36.0–46.0)
Hemoglobin: 12.5 g/dL (ref 12.0–15.0)
Lymphocytes Relative: 37.3 % (ref 12.0–46.0)
Lymphs Abs: 1.7 K/uL (ref 0.7–4.0)
MCHC: 32.9 g/dL (ref 30.0–36.0)
MCV: 87.9 fl (ref 78.0–100.0)
Monocytes Absolute: 0.4 K/uL (ref 0.1–1.0)
Monocytes Relative: 9.3 % (ref 3.0–12.0)
Neutro Abs: 2.2 K/uL (ref 1.4–7.7)
Neutrophils Relative %: 49 % (ref 43.0–77.0)
Platelets: 268 K/uL (ref 150.0–400.0)
RBC: 4.34 Mil/uL (ref 3.87–5.11)
RDW: 13.4 % (ref 11.5–15.5)
WBC: 4.4 K/uL (ref 4.0–10.5)

## 2024-06-19 LAB — BASIC METABOLIC PANEL WITH GFR
BUN: 22 mg/dL (ref 6–23)
CO2: 30 meq/L (ref 19–32)
Calcium: 9.4 mg/dL (ref 8.4–10.5)
Chloride: 103 meq/L (ref 96–112)
Creatinine, Ser: 0.72 mg/dL (ref 0.40–1.20)
GFR: 81.01 mL/min
Glucose, Bld: 95 mg/dL (ref 70–99)
Potassium: 4.7 meq/L (ref 3.5–5.1)
Sodium: 140 meq/L (ref 135–145)

## 2024-06-19 LAB — LIPID PANEL
Cholesterol: 170 mg/dL (ref 28–200)
HDL: 67.2 mg/dL
LDL Cholesterol: 86 mg/dL (ref 10–99)
NonHDL: 102.97
Total CHOL/HDL Ratio: 3
Triglycerides: 86 mg/dL (ref 10.0–149.0)
VLDL: 17.2 mg/dL (ref 0.0–40.0)

## 2024-06-19 LAB — TSH: TSH: 3.75 u[IU]/mL (ref 0.35–5.50)

## 2024-06-19 NOTE — Assessment & Plan Note (Signed)
 Pt's PE WNL.  UTD on mammo, Tdap, PNA, flu.  Check labs.  Anticipatory guidance provided.

## 2024-06-19 NOTE — Patient Instructions (Signed)
 Follow up in 6 months to recheck blood pressure and cholesterol We'll notify you of your lab results and make any changes if needed Continue to work on healthy diet and regular exercise- you look great! Call with any questions or concerns Stay Safe!  Stay Healthy! Happy New Year!!!

## 2024-06-19 NOTE — Progress Notes (Signed)
" ° °  Subjective:    Patient ID: Katherine Blake, female    DOB: [birth year], 79 y.o.   MRN: [id number]  HPI CPE- UTD on mammo, Tdap, PNA, flu.  No longer doing colon cancer screening  Health Maintenance  Topic Date Due   Hepatitis C Screening  Never done   COVID-19 Vaccine (4 - 2025-26 season) 02/11/2024   Mammogram  01/06/2025   Medicare Annual Wellness (AWV)  03/05/2025   DTaP/Tdap/Td (2 - Td or Tdap) 12/26/2027   Pneumococcal Vaccine: 50+ Years  Completed   Influenza Vaccine  Completed   Bone Density Scan  Completed   Zoster Vaccines- Shingrix  Completed   Meningococcal B Vaccine  Aged Out   Colonoscopy  Discontinued   Fecal DNA (Cologuard)  Discontinued    Patient Care Team    Relationship Specialty Notifications Start End  Mahlon Comer BRAVO, MD PCP - General Family Medicine  02/22/17   Verlin Lonni BIRCH, MD PCP - Cardiology Cardiology  11/07/22   Fleeta Smock, Lamar BROCKS, MD Consulting Physician Allergy and Immunology  02/22/17   Kristie Lamprey, MD Consulting Physician Gastroenterology  02/22/17   Livingston Rigg, MD Consulting Physician Dermatology  08/30/17   Arnaldo Juliene GORMAN, MD Attending Physician Family Medicine  08/30/17    Comment: orthopedic  [facility]  08/30/17   Prentiss Annabella LABOR, NP Nurse Practitioner Gynecology  01/16/23       Review of Systems Patient reports no vision/ hearing changes, adenopathy,fever, weight change,  persistant/recurrent hoarseness , swallowing issues, chest pain, palpitations, edema, persistant/recurrent cough, hemoptysis, dyspnea (rest/exertional/paroxysmal nocturnal), gastrointestinal bleeding (melena, rectal bleeding), abdominal pain, significant heartburn, bowel changes, GU symptoms (dysuria, hematuria, incontinence), Gyn symptoms (abnormal  bleeding, pain),  syncope, focal weakness, memory loss, numbness & tingling, skin/hair/nail changes, abnormal bruising or bleeding, anxiety, or depression.     Objective:   Physical Exam General  Appearance:    Alert, cooperative, no distress, appears stated age  Head:    Normocephalic, without obvious abnormality, atraumatic  Eyes:    PERRL, conjunctiva/corneas clear, EOM's intact both eyes  Ears:    Normal TM's and external ear canals, both ears  Nose:   Nares normal, septum midline, mucosa normal, no drainage    or sinus tenderness  Throat:   Lips, mucosa, and tongue normal; teeth and gums normal  Neck:   Supple, symmetrical, trachea midline, no adenopathy;    Thyroid : no enlargement/tenderness/nodules  Back:     Symmetric, no curvature, ROM normal, no CVA tenderness  Lungs:     Clear to auscultation bilaterally, respirations unlabored  Chest Wall:    No tenderness or deformity   Heart:    Regular rate and rhythm, S1 and S2 normal, no murmur, rub   or gallop  Breast Exam:    Deferred to mammo  Abdomen:     Soft, non-tender, bowel sounds active all four quadrants,    no masses, no organomegaly  Genitalia:    Deferred  Rectal:    Extremities:   Extremities normal, atraumatic, no cyanosis or edema  Pulses:   2+ and symmetric all extremities  Skin:   Skin color, texture, turgor normal, no rashes or lesions  Lymph nodes:   Cervical, supraclavicular, and axillary nodes normal  Neurologic:   CNII-XII intact, normal strength, sensation and reflexes    throughout          Assessment & Plan:    "

## 2024-06-20 ENCOUNTER — Ambulatory Visit: Payer: Self-pay | Admitting: Family Medicine

## 2024-12-17 ENCOUNTER — Ambulatory Visit: Admitting: Family Medicine
